# Patient Record
Sex: Male | Born: 1985 | Race: White | Hispanic: No | Marital: Married | Smoking: Never smoker
Health system: Southern US, Community
[De-identification: ages and names within clinical notes are randomized; demographics above are authoritative.]

## PROBLEM LIST (undated history)

## (undated) DIAGNOSIS — F419 Anxiety disorder, unspecified: Secondary | ICD-10-CM

## (undated) DIAGNOSIS — G4700 Insomnia, unspecified: Secondary | ICD-10-CM

## (undated) DIAGNOSIS — F1190 Opioid use, unspecified, uncomplicated: Secondary | ICD-10-CM

## (undated) HISTORY — DX: Insomnia, unspecified: G47.00

## (undated) HISTORY — DX: Opioid use, unspecified, uncomplicated: F11.90

## (undated) HISTORY — DX: Anxiety disorder, unspecified: F41.9

---

## 2019-02-02 ENCOUNTER — Other Ambulatory Visit: Payer: Self-pay | Admitting: Internal Medicine

## 2019-02-02 DIAGNOSIS — R945 Abnormal results of liver function studies: Secondary | ICD-10-CM

## 2019-02-02 DIAGNOSIS — R7989 Other specified abnormal findings of blood chemistry: Secondary | ICD-10-CM

## 2019-02-09 ENCOUNTER — Other Ambulatory Visit: Payer: Self-pay

## 2019-02-09 ENCOUNTER — Ambulatory Visit
Admission: RE | Admit: 2019-02-09 | Discharge: 2019-02-09 | Disposition: A | Discharge status: Home / Self Care | Payer: Managed Care, Other (non HMO) | Source: Ambulatory Visit | Attending: Internal Medicine | Admitting: Internal Medicine

## 2019-02-09 DIAGNOSIS — R945 Abnormal results of liver function studies: Secondary | ICD-10-CM | POA: Diagnosis present

## 2019-02-09 DIAGNOSIS — R7989 Other specified abnormal findings of blood chemistry: Secondary | ICD-10-CM

## 2019-02-09 IMAGING — US ULTRASOUND ABDOMEN LIMITED
1 series · 14 of 25 positions shown · non-contrast
Comparison: None.

CLINICAL DATA: Abnormal liver function tests.

EXAM:
ULTRASOUND ABDOMEN LIMITED RIGHT UPPER QUADRANT

[Series 1: ultrasound abdomen limited · 0.17mm/px · 14 of 77 slices shown]
[im 1/77]
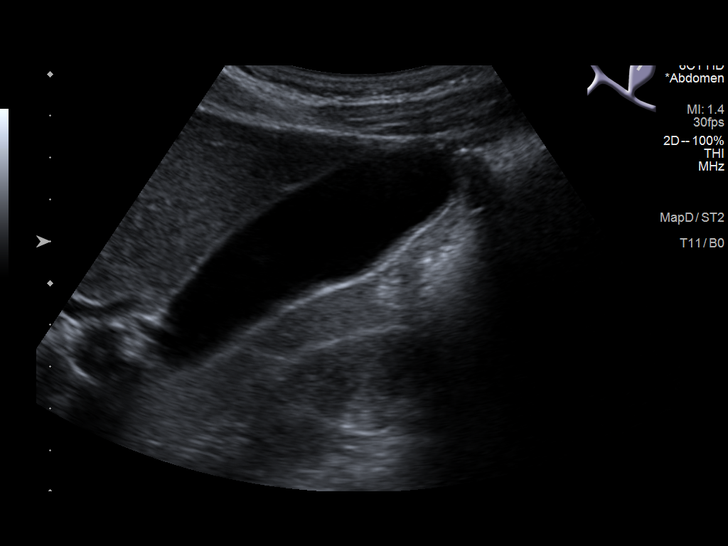
[im 7/77]
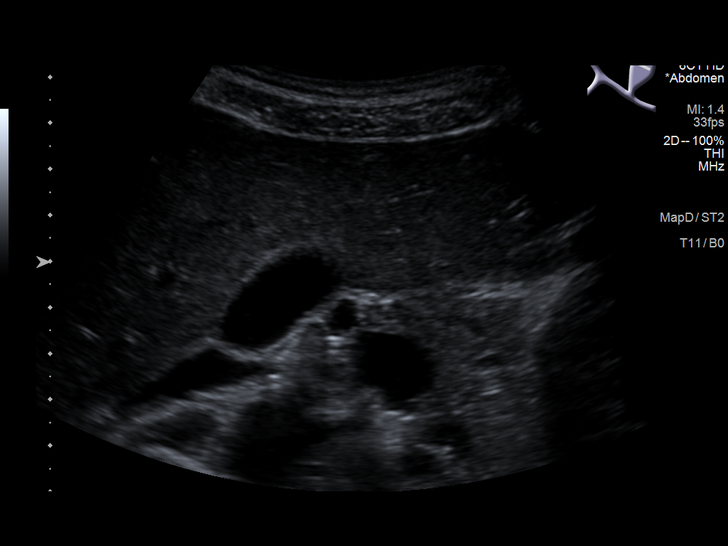
[im 13/77]
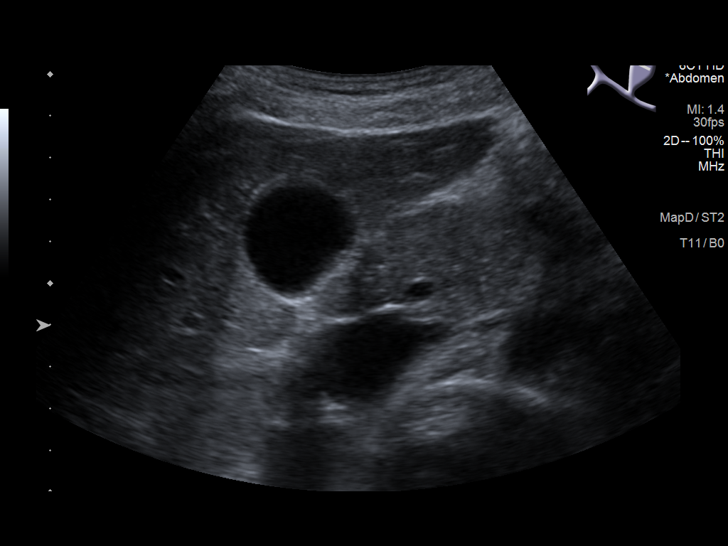
[im 20/77]
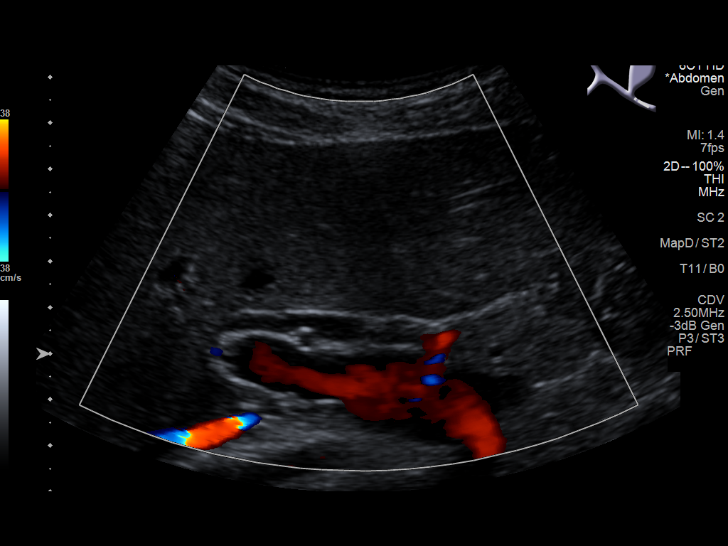
[im 26/77]
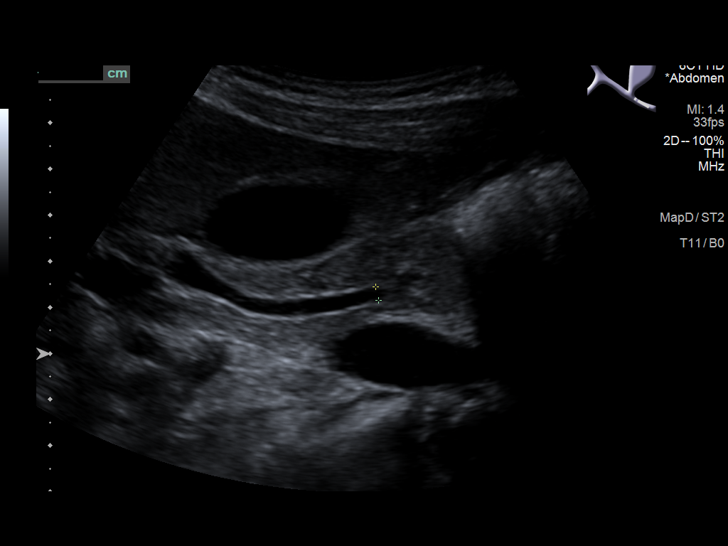
[im 29/77]
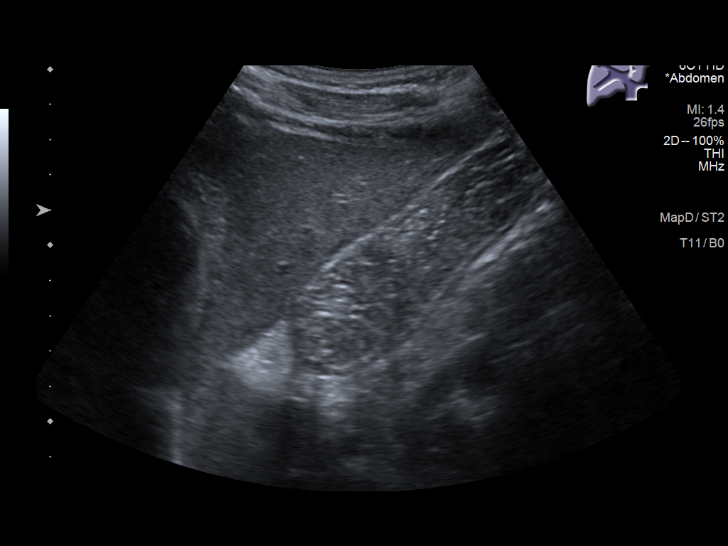
[im 35/77]
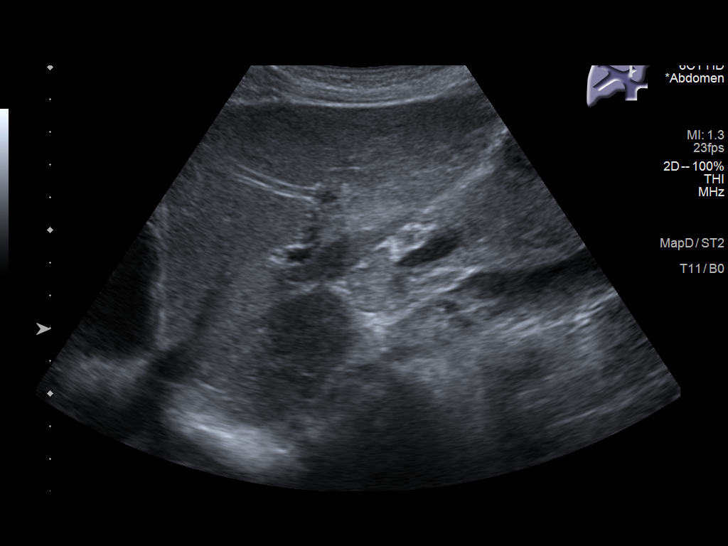
[im 42/77]
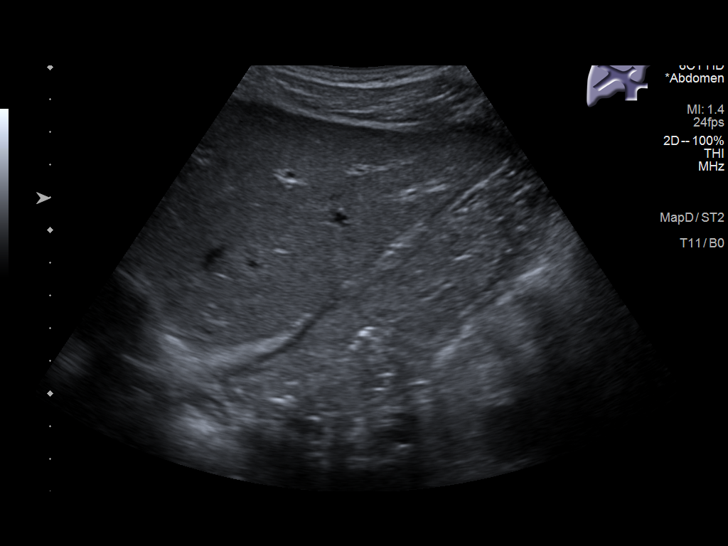
[im 48/77]
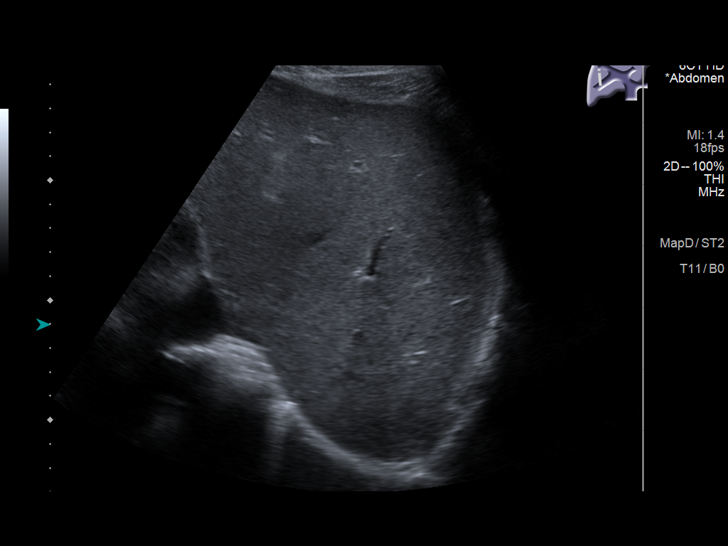
[im 51/77]
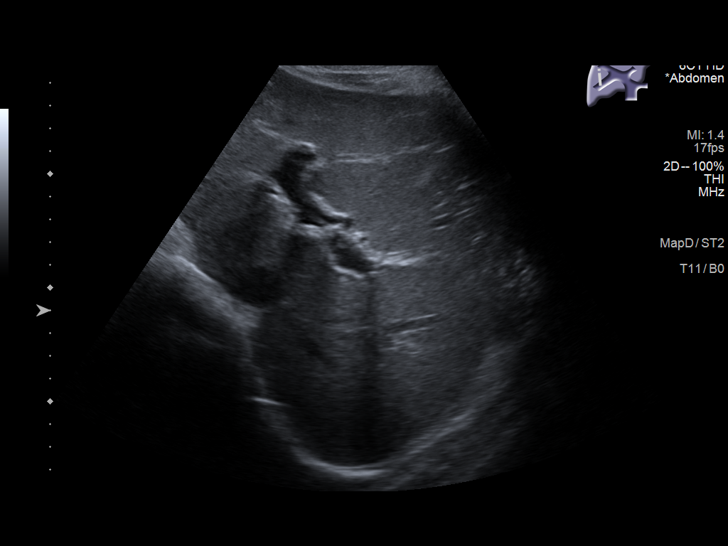
[im 58/77]
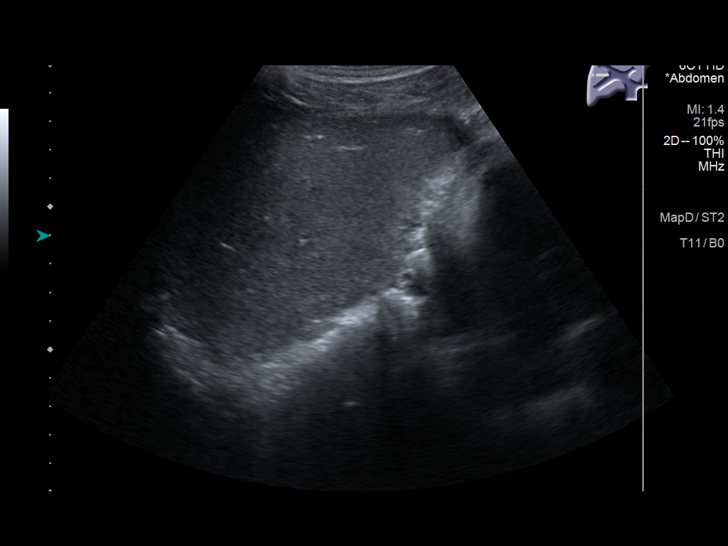
[im 64/77]
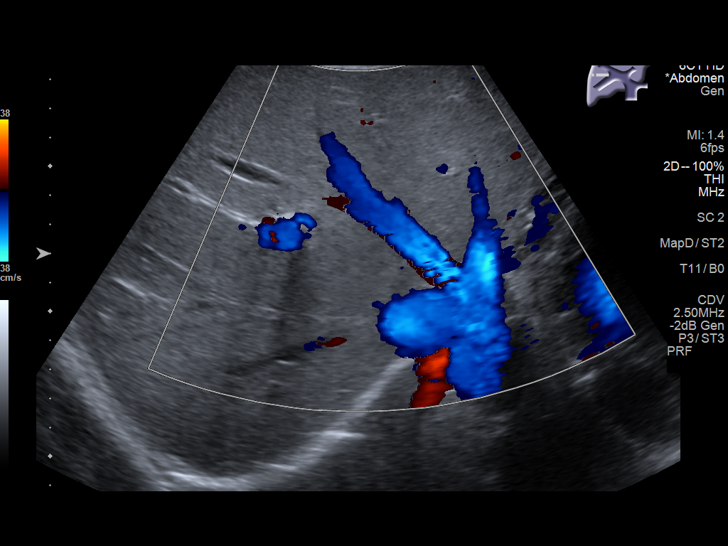
[im 70/77]
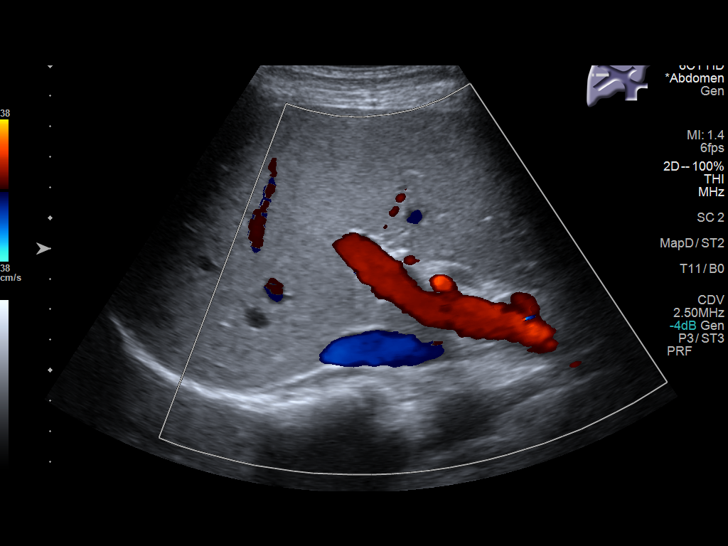
[im 77/77]
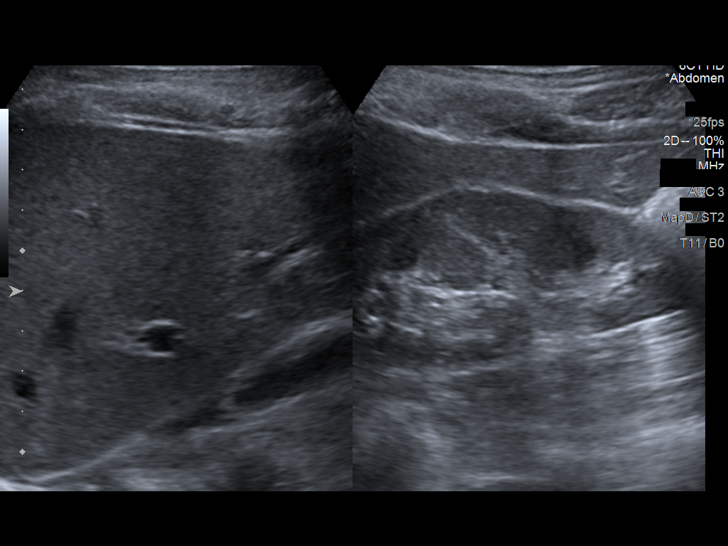

[14 of 25 positions shown; findings below may reference images not displayed]

FINDINGS: Gallbladder:

No gallstones or wall thickening visualized. No sonographic Murphy
sign noted by sonographer.

Common bile duct:

Diameter: 3.3 mm

Liver:

No focal lesion identified. Within normal limits in parenchymal
echogenicity. Portal vein is patent on color Doppler imaging with
normal direction of blood flow towards the liver.

Other: None.
IMPRESSION: Normal right upper quadrant ultrasound.

## 2019-10-11 ENCOUNTER — Other Ambulatory Visit: Payer: Self-pay | Admitting: Student

## 2019-10-11 DIAGNOSIS — M7521 Bicipital tendinitis, right shoulder: Secondary | ICD-10-CM

## 2019-10-11 DIAGNOSIS — S42114A Nondisplaced fracture of body of scapula, right shoulder, initial encounter for closed fracture: Secondary | ICD-10-CM

## 2019-10-11 DIAGNOSIS — M7581 Other shoulder lesions, right shoulder: Secondary | ICD-10-CM

## 2019-10-15 ENCOUNTER — Other Ambulatory Visit: Payer: Self-pay

## 2019-10-15 ENCOUNTER — Emergency Department
Admission: EM | Admit: 2019-10-15 | Discharge: 2019-10-15 | Disposition: A | Discharge status: Home / Self Care | Payer: Managed Care, Other (non HMO) | Attending: Emergency Medicine | Admitting: Emergency Medicine

## 2019-10-15 ENCOUNTER — Emergency Department: Payer: Managed Care, Other (non HMO)

## 2019-10-15 DIAGNOSIS — Y9389 Activity, other specified: Secondary | ICD-10-CM | POA: Insufficient documentation

## 2019-10-15 DIAGNOSIS — S91312A Laceration without foreign body, left foot, initial encounter: Secondary | ICD-10-CM | POA: Insufficient documentation

## 2019-10-15 DIAGNOSIS — Y999 Unspecified external cause status: Secondary | ICD-10-CM | POA: Insufficient documentation

## 2019-10-15 DIAGNOSIS — Y92410 Unspecified street and highway as the place of occurrence of the external cause: Secondary | ICD-10-CM | POA: Diagnosis not present

## 2019-10-15 IMAGING — DX DG FOOT COMPLETE 3+V*L*
3 series · 3 of 3 positions shown · non-contrast
Comparison: None.

CLINICAL DATA: Pain

EXAM:
LEFT FOOT - COMPLETE 3+ VIEW

[foot ap]
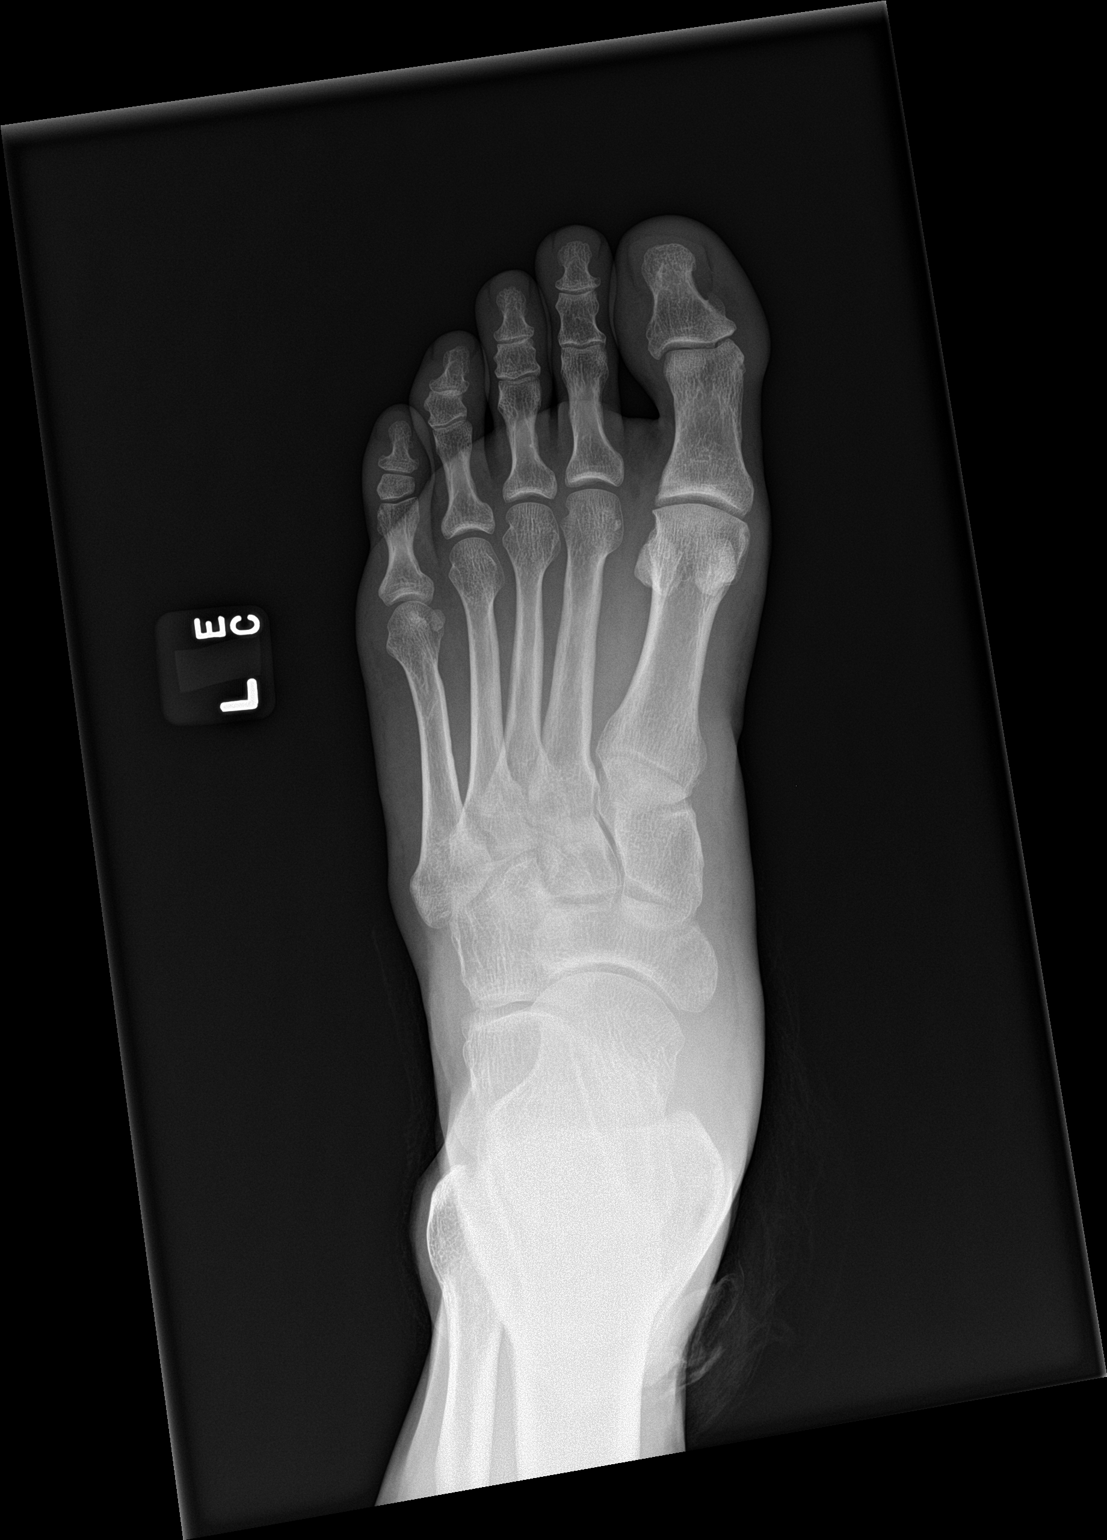

[foot obl]
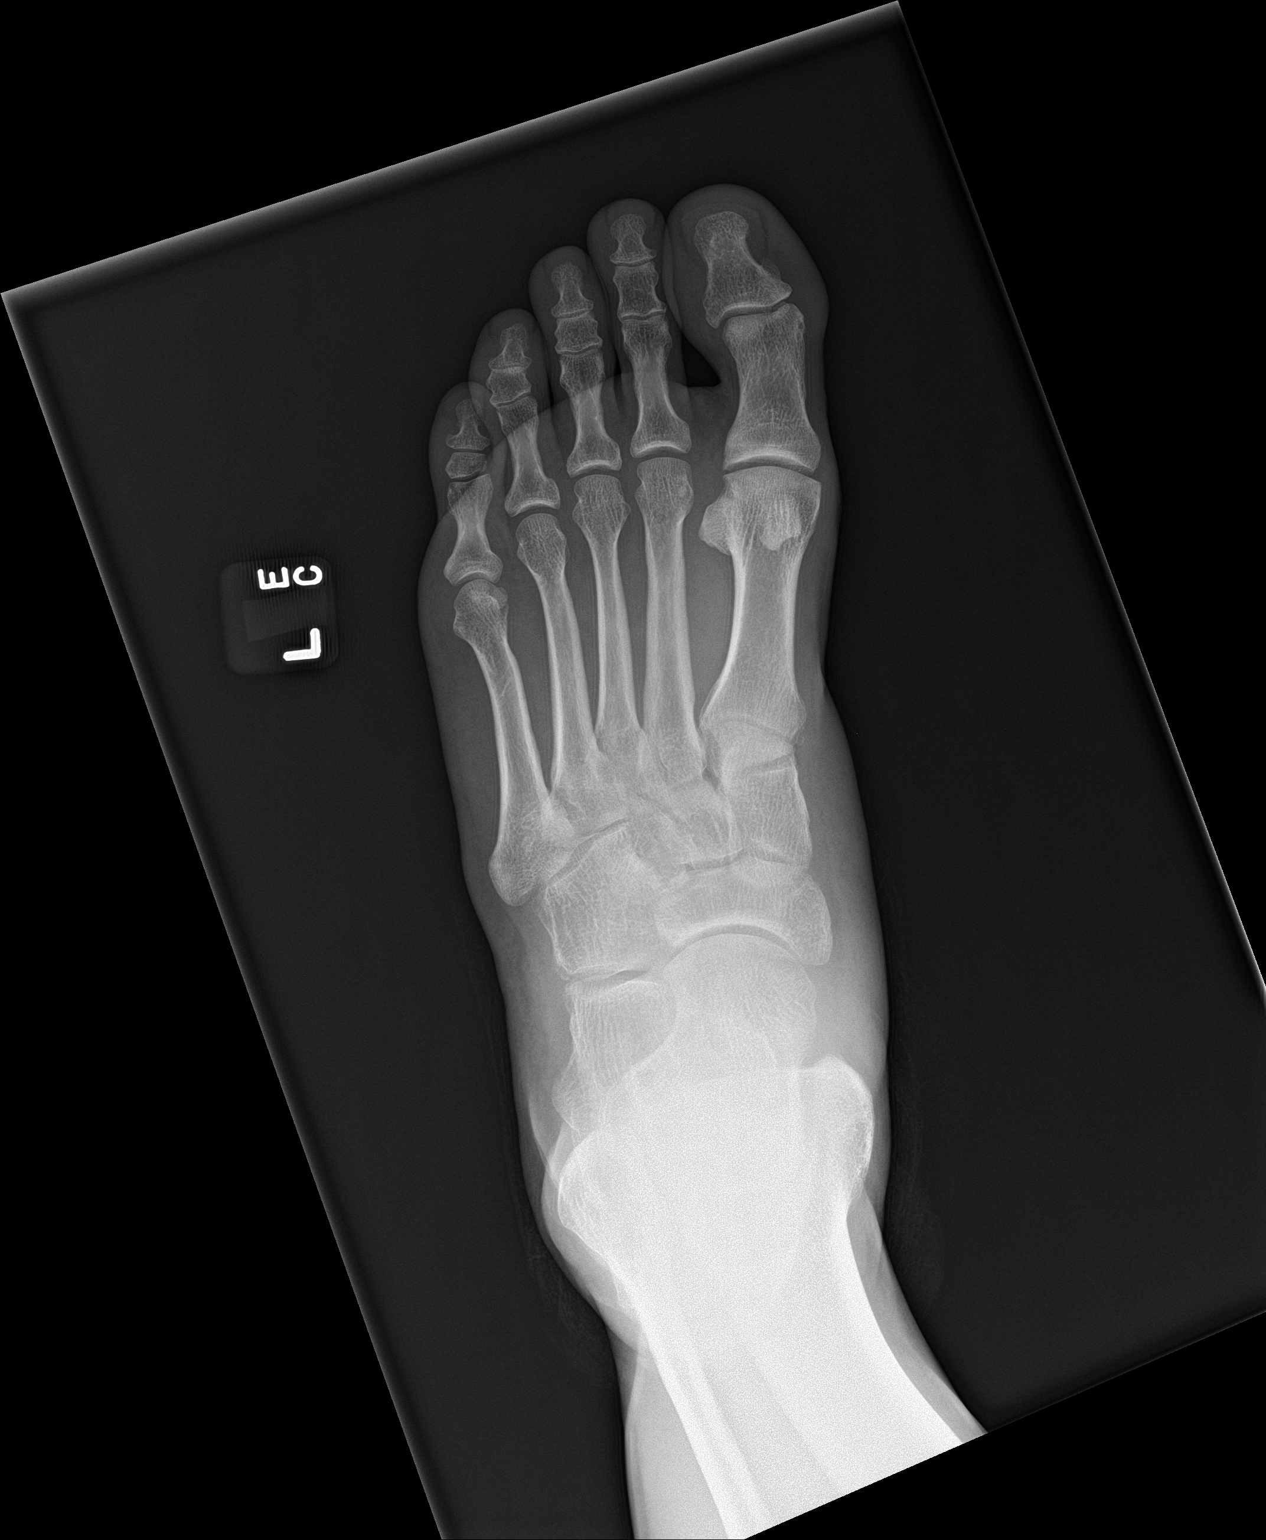

[foot lat]
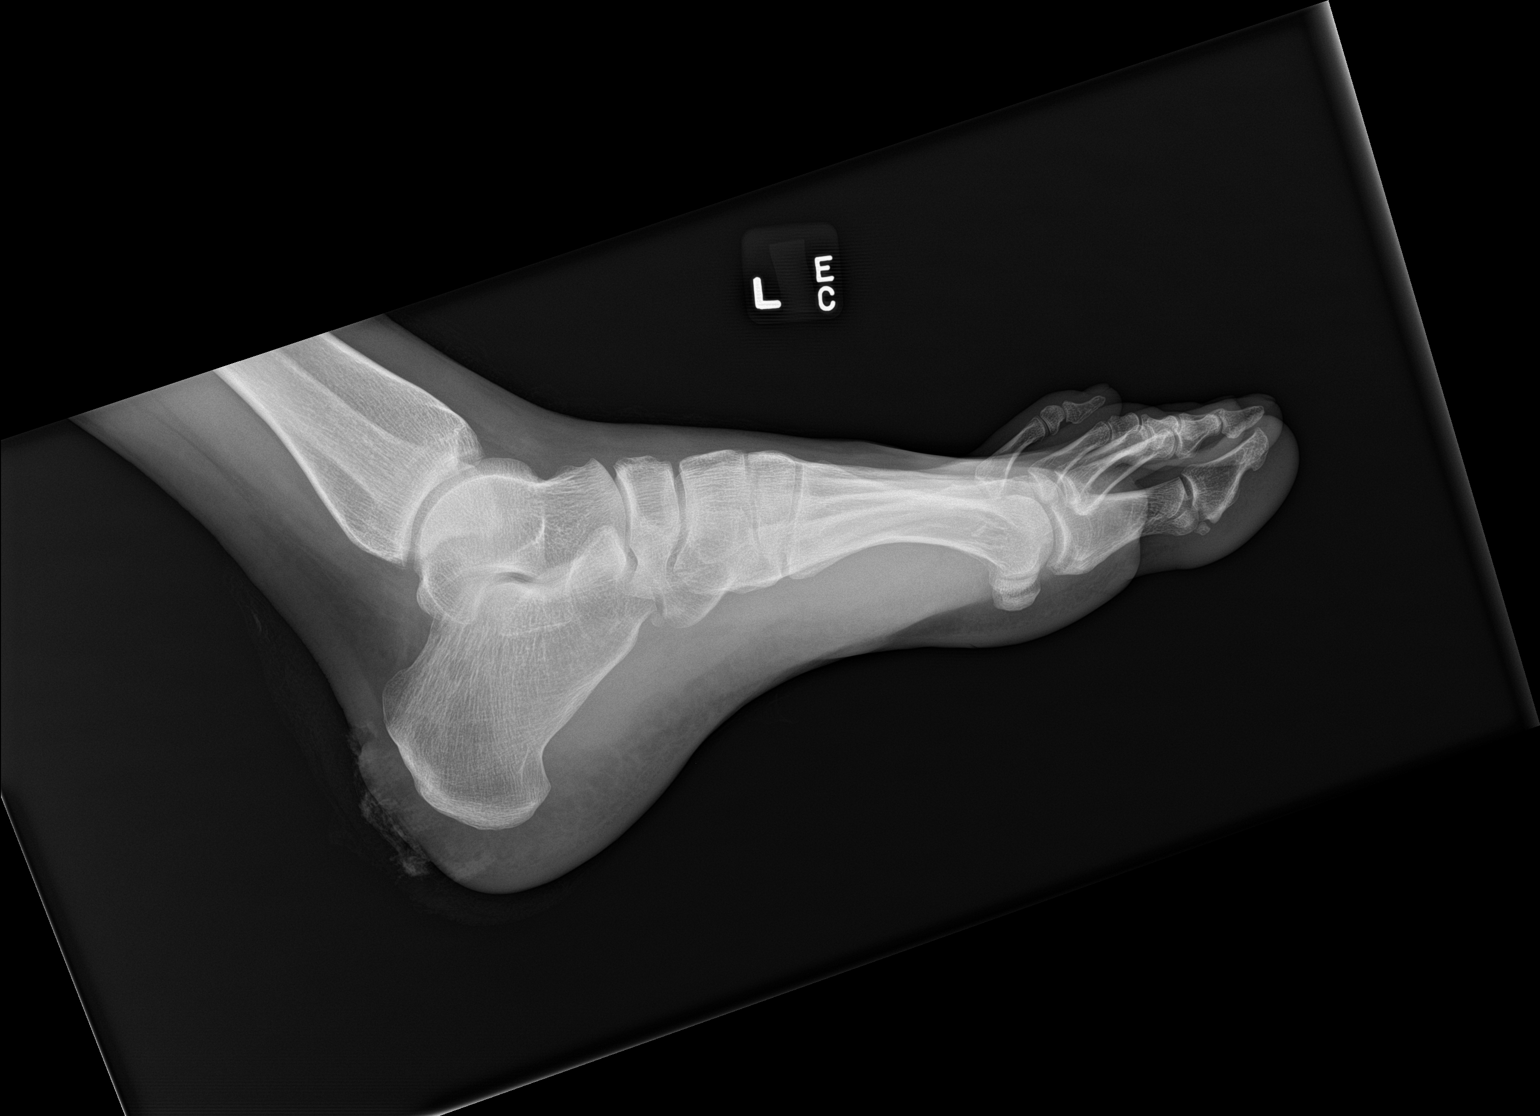

[3 of 3 positions shown; findings below may reference images not displayed]

FINDINGS: There is soft tissue swelling about the posterior calcaneus with an
associated laceration and pockets of subcutaneous gas. There is no
acute displaced fracture or dislocation. There is no radiopaque
foreign body.
IMPRESSION: No acute osseous abnormality. Soft tissue swelling about the
posterior calcaneus with associated laceration and pockets of
subcutaneous gas.
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
## 2019-10-15 MED ORDER — FENTANYL CITRATE (PF) 100 MCG/2ML IJ SOLN
50.0000 ug | Freq: Once | INTRAMUSCULAR | Status: AC
  Administered 2019-10-15: 50 ug via INTRAVENOUS
  Filled 2019-10-15: qty 2

## 2019-10-15 MED ORDER — HYDROMORPHONE HCL 1 MG/ML IJ SOLN
1.0000 mg | Freq: Once | INTRAMUSCULAR | Status: AC
  Administered 2019-10-15: 1 mg via INTRAVENOUS
  Filled 2019-10-15: qty 1

## 2019-10-15 MED ORDER — SULFAMETHOXAZOLE-TRIMETHOPRIM 800-160 MG PO TABS: 1.0000 | ORAL_TABLET | Freq: Two times a day (BID) | ORAL | 0 refills | Status: DC

## 2019-10-15 MED ORDER — LIDOCAINE HCL (PF) 1 % IJ SOLN
20.0000 mL | Freq: Once | INTRAMUSCULAR | Status: AC
  Administered 2019-10-15: 22:00:00 20 mL
  Filled 2019-10-15: qty 20

## 2019-10-15 MED ORDER — SODIUM CHLORIDE 0.9 % IV BOLUS
1000.0000 mL | Freq: Once | INTRAVENOUS | Status: AC
  Administered 2019-10-15: 21:00:00 1000 mL via INTRAVENOUS

## 2019-10-15 MED ORDER — OXYCODONE-ACETAMINOPHEN 5-325 MG PO TABS: 1.0000 | ORAL_TABLET | Freq: Four times a day (QID) | ORAL | 0 refills | Status: DC | PRN

## 2019-10-15 MED ORDER — ONDANSETRON HCL 4 MG/2ML IJ SOLN
4.0000 mg | Freq: Once | INTRAMUSCULAR | Status: AC
  Administered 2019-10-15: 4 mg via INTRAVENOUS
  Filled 2019-10-15: qty 2
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
## 2019-10-19 ENCOUNTER — Other Ambulatory Visit: Payer: Self-pay

## 2019-10-19 ENCOUNTER — Encounter: Payer: Self-pay | Admitting: Emergency Medicine

## 2019-10-19 ENCOUNTER — Emergency Department: Payer: Managed Care, Other (non HMO)

## 2019-10-19 ENCOUNTER — Inpatient Hospital Stay
Admission: EM | Admit: 2019-10-19 | Discharge: 2019-10-23 | DRG: 580 | Disposition: A | Discharge status: Home / Self Care | Payer: Managed Care, Other (non HMO) | Attending: Internal Medicine | Admitting: Internal Medicine

## 2019-10-19 DIAGNOSIS — L0390 Cellulitis, unspecified: Secondary | ICD-10-CM | POA: Diagnosis present

## 2019-10-19 DIAGNOSIS — Z20822 Contact with and (suspected) exposure to covid-19: Secondary | ICD-10-CM | POA: Diagnosis present

## 2019-10-19 DIAGNOSIS — F329 Major depressive disorder, single episode, unspecified: Secondary | ICD-10-CM | POA: Diagnosis present

## 2019-10-19 DIAGNOSIS — Z888 Allergy status to other drugs, medicaments and biological substances status: Secondary | ICD-10-CM

## 2019-10-19 DIAGNOSIS — R7989 Other specified abnormal findings of blood chemistry: Secondary | ICD-10-CM

## 2019-10-19 DIAGNOSIS — L02612 Cutaneous abscess of left foot: Secondary | ICD-10-CM | POA: Diagnosis present

## 2019-10-19 DIAGNOSIS — F0630 Mood disorder due to known physiological condition, unspecified: Secondary | ICD-10-CM

## 2019-10-19 DIAGNOSIS — S91312D Laceration without foreign body, left foot, subsequent encounter: Secondary | ICD-10-CM

## 2019-10-19 DIAGNOSIS — T148XXA Other injury of unspecified body region, initial encounter: Secondary | ICD-10-CM | POA: Diagnosis not present

## 2019-10-19 DIAGNOSIS — F32A Depression, unspecified: Secondary | ICD-10-CM

## 2019-10-19 DIAGNOSIS — L03116 Cellulitis of left lower limb: Secondary | ICD-10-CM

## 2019-10-19 DIAGNOSIS — K219 Gastro-esophageal reflux disease without esophagitis: Secondary | ICD-10-CM

## 2019-10-19 DIAGNOSIS — R945 Abnormal results of liver function studies: Secondary | ICD-10-CM

## 2019-10-19 DIAGNOSIS — Z880 Allergy status to penicillin: Secondary | ICD-10-CM

## 2019-10-19 DIAGNOSIS — Y9355 Activity, bike riding: Secondary | ICD-10-CM | POA: Diagnosis not present

## 2019-10-19 DIAGNOSIS — L089 Local infection of the skin and subcutaneous tissue, unspecified: Secondary | ICD-10-CM | POA: Diagnosis not present

## 2019-10-19 LAB — COMPREHENSIVE METABOLIC PANEL
ALT: 86 U/L — ABNORMAL HIGH (ref 0–44)
AST: 49 U/L — ABNORMAL HIGH (ref 15–41)
Albumin: 4.1 g/dL (ref 3.5–5.0)
Alkaline Phosphatase: 54 U/L (ref 38–126)
Anion gap: 7 (ref 5–15)
BUN: 19 mg/dL (ref 6–20)
CO2: 28 mmol/L (ref 22–32)
Calcium: 9.4 mg/dL (ref 8.9–10.3)
Chloride: 102 mmol/L (ref 98–111)
Creatinine, Ser: 1.04 mg/dL (ref 0.61–1.24)
GFR calc Af Amer: >60 mL/min (ref >60)
GFR calc non Af Amer: >60 mL/min (ref >60)
Glucose, Bld: 114 mg/dL — ABNORMAL HIGH (ref 70–99)
Potassium: 4 mmol/L (ref 3.5–5.1)
Sodium: 137 mmol/L (ref 135–145)
Total Bilirubin: 0.6 mg/dL (ref 0.3–1.2)
Total Protein: 7.3 g/dL (ref 6.5–8.1)

## 2019-10-19 LAB — CBC WITH DIFFERENTIAL/PLATELET
Abs Immature Granulocytes: 0.03 10*3/uL (ref 0.00–0.07)
Basophils Absolute: 0 10*3/uL (ref 0.0–0.1)
Basophils Relative: 0 %
Eosinophils Absolute: 0.5 10*3/uL (ref 0.0–0.5)
Eosinophils Relative: 5 %
HCT: 39.4 % (ref 39.0–52.0)
Hemoglobin: 13.3 g/dL (ref 13.0–17.0)
Immature Granulocytes: 0 %
Lymphocytes Relative: 25 %
Lymphs Abs: 2.4 10*3/uL (ref 0.7–4.0)
MCH: 32.5 pg (ref 26.0–34.0)
MCHC: 33.8 g/dL (ref 30.0–36.0)
MCV: 96.3 fL (ref 80.0–100.0)
Monocytes Absolute: 0.8 10*3/uL (ref 0.1–1.0)
Monocytes Relative: 8 %
Neutro Abs: 5.9 10*3/uL (ref 1.7–7.7)
Neutrophils Relative %: 62 %
Platelets: 235 10*3/uL (ref 150–400)
RBC: 4.09 MIL/uL — ABNORMAL LOW (ref 4.22–5.81)
RDW: 12.9 % (ref 11.5–15.5)
WBC: 9.5 10*3/uL (ref 4.0–10.5)
nRBC: 0 % (ref 0.0–0.2)

## 2019-10-19 LAB — URINALYSIS, COMPLETE (UACMP) WITH MICROSCOPIC
Bacteria, UA: NONE SEEN
Bilirubin Urine: NEGATIVE
Glucose, UA: NEGATIVE mg/dL
Hgb urine dipstick: NEGATIVE
Ketones, ur: NEGATIVE mg/dL
Leukocytes,Ua: NEGATIVE
Nitrite: NEGATIVE
Protein, ur: NEGATIVE mg/dL
Specific Gravity, Urine: 1.008 (ref 1.005–1.030)
pH: 6 (ref 5.0–8.0)

## 2019-10-19 LAB — LACTIC ACID, PLASMA: Lactic Acid, Venous: 0.8 mmol/L (ref 0.5–1.9)
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
## 2019-10-19 MED ORDER — MORPHINE SULFATE (PF) 4 MG/ML IV SOLN
4.0000 mg | Freq: Once | INTRAVENOUS | Status: AC
  Administered 2019-10-19: 4 mg via INTRAVENOUS
  Filled 2019-10-19: qty 1

## 2019-10-19 MED ORDER — CELECOXIB 200 MG PO CAPS
200.0000 mg | ORAL_CAPSULE | Freq: Two times a day (BID) | ORAL | Status: DC
  Administered 2019-10-20 – 2019-10-23 (×8): 200 mg via ORAL
  Filled 2019-10-19 (×9): qty 1

## 2019-10-19 MED ORDER — CLONAZEPAM 1 MG PO TABS: 1.0000 mg | ORAL_TABLET | Freq: Two times a day (BID) | ORAL | Status: DC | PRN

## 2019-10-19 MED ORDER — SODIUM CHLORIDE 0.9% FLUSH
3.0000 mL | Freq: Once | INTRAVENOUS | Status: AC
  Administered 2019-10-20: 3 mL via INTRAVENOUS

## 2019-10-19 MED ORDER — MORPHINE SULFATE (PF) 2 MG/ML IV SOLN
1.0000 mg | INTRAVENOUS | Status: DC | PRN
  Administered 2019-10-20 (×2): 1 mg via INTRAVENOUS
  Filled 2019-10-19 (×2): qty 1

## 2019-10-19 MED ORDER — OXYCODONE-ACETAMINOPHEN 7.5-325 MG PO TABS
1.0000 | ORAL_TABLET | Freq: Four times a day (QID) | ORAL | Status: DC | PRN
  Administered 2019-10-20 (×2): 1 via ORAL
  Filled 2019-10-19 (×2): qty 1

## 2019-10-19 MED ORDER — GADOBUTROL 1 MMOL/ML IV SOLN
7.0000 mL | Freq: Once | INTRAVENOUS | Status: AC | PRN
  Administered 2019-10-19: 7 mL via INTRAVENOUS
  Filled 2019-10-19: qty 7.5

## 2019-10-19 MED ORDER — HYDROMORPHONE HCL 1 MG/ML IJ SOLN: 1.0000 mg | Freq: Once | INTRAMUSCULAR | Status: AC

## 2019-10-19 MED ORDER — PANTOPRAZOLE SODIUM 40 MG PO TBEC
40.0000 mg | DELAYED_RELEASE_TABLET | Freq: Every day | ORAL | Status: DC
  Administered 2019-10-20 – 2019-10-23 (×4): 40 mg via ORAL
  Filled 2019-10-19 (×4): qty 1

## 2019-10-19 MED ORDER — HYDROMORPHONE HCL 1 MG/ML IJ SOLN
INTRAMUSCULAR | Status: AC
  Administered 2019-10-19: 1 mg via INTRAVENOUS
  Filled 2019-10-19: qty 1

## 2019-10-19 MED ORDER — VANCOMYCIN HCL IN DEXTROSE 1-5 GM/200ML-% IV SOLN
1000.0000 mg | Freq: Once | INTRAVENOUS | Status: AC
  Administered 2019-10-19: 1000 mg via INTRAVENOUS
  Filled 2019-10-19: qty 200

## 2019-10-19 MED ORDER — CLINDAMYCIN PHOSPHATE 600 MG/50ML IV SOLN
600.0000 mg | Freq: Three times a day (TID) | INTRAVENOUS | Status: DC
  Administered 2019-10-20 – 2019-10-23 (×11): 600 mg via INTRAVENOUS
  Filled 2019-10-19 (×14): qty 50
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
## 2019-10-19 NOTE — ED Notes (Signed)
FIRST NURSE NOTE- here for cellulitis from dr Engineer, production office. On crutches without distress. Splint noted to RLE

## 2019-10-19 NOTE — ED Notes (Signed)
Patient transported to MRI 

## 2019-10-19 NOTE — ED Notes (Signed)
Pt reports dr Engineer, production supposed to be taking to surgery. No one has heard from dr Engineer, production.  Dr Mayford Knife to speak with pt and will find out from on call doctor plan.
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
## 2019-10-19 NOTE — ED Notes (Signed)
Collected 1 set of blood cultures with iv start and sent to lab with temporary labels.

## 2019-10-19 NOTE — ED Notes (Signed)
Patient is in MRI, patient's wife at bedside, given TV remote and recliner

## 2019-10-20 ENCOUNTER — Inpatient Hospital Stay: Payer: Managed Care, Other (non HMO) | Admitting: Anesthesiology

## 2019-10-20 ENCOUNTER — Encounter: Payer: Self-pay | Admitting: Family Medicine

## 2019-10-20 ENCOUNTER — Encounter
Admission: EM | Disposition: A | Discharge status: Home / Self Care | Payer: Self-pay | Source: Home / Self Care | Attending: Internal Medicine

## 2019-10-20 ENCOUNTER — Other Ambulatory Visit: Payer: Self-pay

## 2019-10-20 DIAGNOSIS — F329 Major depressive disorder, single episode, unspecified: Secondary | ICD-10-CM

## 2019-10-20 DIAGNOSIS — K219 Gastro-esophageal reflux disease without esophagitis: Secondary | ICD-10-CM

## 2019-10-20 DIAGNOSIS — R945 Abnormal results of liver function studies: Secondary | ICD-10-CM

## 2019-10-20 DIAGNOSIS — L03116 Cellulitis of left lower limb: Secondary | ICD-10-CM

## 2019-10-20 HISTORY — PX: IRRIGATION AND DEBRIDEMENT FOOT: SHX6602

## 2019-10-20 LAB — CBC
HCT: 34.6 % — ABNORMAL LOW (ref 39.0–52.0)
Hemoglobin: 12.1 g/dL — ABNORMAL LOW (ref 13.0–17.0)
MCH: 32.9 pg (ref 26.0–34.0)
MCHC: 35 g/dL (ref 30.0–36.0)
MCV: 94 fL (ref 80.0–100.0)
Platelets: 201 10*3/uL (ref 150–400)
RBC: 3.68 MIL/uL — ABNORMAL LOW (ref 4.22–5.81)
RDW: 12.9 % (ref 11.5–15.5)
WBC: 7 10*3/uL (ref 4.0–10.5)
nRBC: 0 % (ref 0.0–0.2)

## 2019-10-20 LAB — BASIC METABOLIC PANEL
Anion gap: 6 (ref 5–15)
BUN: 19 mg/dL (ref 6–20)
CO2: 28 mmol/L (ref 22–32)
Calcium: 8.9 mg/dL (ref 8.9–10.3)
Chloride: 102 mmol/L (ref 98–111)
Creatinine, Ser: 1.04 mg/dL (ref 0.61–1.24)
GFR calc Af Amer: >60 mL/min (ref >60)
GFR calc non Af Amer: >60 mL/min (ref >60)
Glucose, Bld: 84 mg/dL (ref 70–99)
Potassium: 4.1 mmol/L (ref 3.5–5.1)
Sodium: 136 mmol/L (ref 135–145)

## 2019-10-20 LAB — HIV ANTIBODY (ROUTINE TESTING W REFLEX): HIV Screen 4th Generation wRfx: NONREACTIVE

## 2019-10-20 LAB — SARS CORONAVIRUS 2 (TAT 6-24 HRS): SARS Coronavirus 2: NEGATIVE

## 2019-10-20 LAB — RESPIRATORY PANEL BY RT PCR (FLU A&B, COVID)
Influenza A by PCR: NEGATIVE
Influenza B by PCR: NEGATIVE
SARS Coronavirus 2 by RT PCR: NEGATIVE

## 2019-10-20 SURGERY — IRRIGATION AND DEBRIDEMENT FOOT
Anesthesia: General | Site: Foot | Laterality: Left

## 2019-10-20 MED ORDER — EPHEDRINE 5 MG/ML INJ
INTRAVENOUS | Status: AC
  Filled 2019-10-20: qty 10

## 2019-10-20 MED ORDER — ACETAMINOPHEN 10 MG/ML IV SOLN
INTRAVENOUS | Status: AC
  Filled 2019-10-20: qty 100

## 2019-10-20 MED ORDER — FENTANYL CITRATE (PF) 100 MCG/2ML IJ SOLN
INTRAMUSCULAR | Status: AC
  Administered 2019-10-20: 25 ug via INTRAVENOUS
  Filled 2019-10-20: qty 2

## 2019-10-20 MED ORDER — SUGAMMADEX SODIUM 200 MG/2ML IV SOLN
INTRAVENOUS | Status: DC | PRN
  Administered 2019-10-20: 200 mg via INTRAVENOUS

## 2019-10-20 MED ORDER — EPHEDRINE SULFATE 50 MG/ML IJ SOLN
INTRAMUSCULAR | Status: DC | PRN
  Administered 2019-10-20 (×2): 10 mg via INTRAVENOUS

## 2019-10-20 MED ORDER — ONDANSETRON HCL 4 MG/2ML IJ SOLN: 4.0000 mg | Freq: Once | INTRAMUSCULAR | Status: DC | PRN

## 2019-10-20 MED ORDER — OXYCODONE-ACETAMINOPHEN 7.5-325 MG PO TABS: 1.0000 | ORAL_TABLET | Freq: Four times a day (QID) | ORAL | Status: DC | PRN

## 2019-10-20 MED ORDER — OXYCODONE-ACETAMINOPHEN 7.5-325 MG PO TABS
1.0000 | ORAL_TABLET | ORAL | Status: DC | PRN
  Administered 2019-10-20 – 2019-10-23 (×8): 2 via ORAL
  Filled 2019-10-20 (×8): qty 2

## 2019-10-20 MED ORDER — ONDANSETRON HCL 4 MG/2ML IJ SOLN
INTRAMUSCULAR | Status: AC
  Filled 2019-10-20: qty 2

## 2019-10-20 MED ORDER — DOCUSATE SODIUM 100 MG PO CAPS
100.0000 mg | ORAL_CAPSULE | Freq: Two times a day (BID) | ORAL | Status: DC
  Administered 2019-10-20 – 2019-10-23 (×6): 100 mg via ORAL
  Filled 2019-10-20 (×6): qty 1

## 2019-10-20 MED ORDER — PROPOFOL 10 MG/ML IV BOLUS
INTRAVENOUS | Status: AC
  Filled 2019-10-20: qty 20

## 2019-10-20 MED ORDER — ACETAMINOPHEN 10 MG/ML IV SOLN
INTRAVENOUS | Status: DC | PRN
  Administered 2019-10-20: 1000 mg via INTRAVENOUS

## 2019-10-20 MED ORDER — ENOXAPARIN SODIUM 40 MG/0.4ML ~~LOC~~ SOLN
40.0000 mg | SUBCUTANEOUS | Status: DC
  Administered 2019-10-21 – 2019-10-23 (×3): 40 mg via SUBCUTANEOUS
  Filled 2019-10-20 (×3): qty 0.4

## 2019-10-20 MED ORDER — BUPIVACAINE HCL (PF) 0.5 % IJ SOLN
INTRAMUSCULAR | Status: DC | PRN
  Administered 2019-10-20: 8 mL

## 2019-10-20 MED ORDER — MIDAZOLAM HCL 2 MG/2ML IJ SOLN
INTRAMUSCULAR | Status: DC | PRN
  Administered 2019-10-20: 2 mg via INTRAVENOUS

## 2019-10-20 MED ORDER — PHENYLEPHRINE HCL (PRESSORS) 10 MG/ML IV SOLN
INTRAVENOUS | Status: DC | PRN
  Administered 2019-10-20 (×3): 50 ug via INTRAVENOUS

## 2019-10-20 MED ORDER — FENTANYL CITRATE (PF) 100 MCG/2ML IJ SOLN
INTRAMUSCULAR | Status: AC
  Filled 2019-10-20: qty 2

## 2019-10-20 MED ORDER — MIDAZOLAM HCL 2 MG/2ML IJ SOLN
INTRAMUSCULAR | Status: AC
  Filled 2019-10-20: qty 2

## 2019-10-20 MED ORDER — SENNA 8.6 MG PO TABS: 1.0000 | ORAL_TABLET | Freq: Every day | ORAL | Status: DC | PRN

## 2019-10-20 MED ORDER — OXYCODONE-ACETAMINOPHEN 7.5-325 MG PO TABS
1.0000 | ORAL_TABLET | ORAL | Status: DC | PRN
  Administered 2019-10-20: 1 via ORAL
  Filled 2019-10-20: qty 1

## 2019-10-20 MED ORDER — CLINDAMYCIN PHOSPHATE 600 MG/50ML IV SOLN
INTRAVENOUS | Status: AC
  Filled 2019-10-20: qty 50

## 2019-10-20 MED ORDER — ROCURONIUM BROMIDE 100 MG/10ML IV SOLN
INTRAVENOUS | Status: DC | PRN
  Administered 2019-10-20: 50 mg via INTRAVENOUS

## 2019-10-20 MED ORDER — HYDROMORPHONE HCL 1 MG/ML IJ SOLN
2.0000 mg | INTRAMUSCULAR | Status: DC | PRN
  Administered 2019-10-20 – 2019-10-23 (×6): 2 mg via INTRAVENOUS
  Filled 2019-10-20 (×7): qty 2

## 2019-10-20 MED ORDER — FENTANYL CITRATE (PF) 100 MCG/2ML IJ SOLN
25.0000 ug | INTRAMUSCULAR | Status: DC | PRN
  Administered 2019-10-20 (×3): 25 ug via INTRAVENOUS

## 2019-10-20 MED ORDER — PROPOFOL 10 MG/ML IV BOLUS
INTRAVENOUS | Status: DC | PRN
  Administered 2019-10-20: 160 mg via INTRAVENOUS

## 2019-10-20 MED ORDER — LIDOCAINE HCL (CARDIAC) PF 100 MG/5ML IV SOSY
PREFILLED_SYRINGE | INTRAVENOUS | Status: DC | PRN
  Administered 2019-10-20: 80 mg via INTRAVENOUS

## 2019-10-20 MED ORDER — FENTANYL CITRATE (PF) 100 MCG/2ML IJ SOLN
INTRAMUSCULAR | Status: DC | PRN
  Administered 2019-10-20 (×2): 50 ug via INTRAVENOUS

## 2019-10-20 MED ORDER — LIDOCAINE HCL (PF) 2 % IJ SOLN
INTRAMUSCULAR | Status: AC
  Filled 2019-10-20: qty 5

## 2019-10-20 MED ORDER — DEXAMETHASONE SODIUM PHOSPHATE 10 MG/ML IJ SOLN
INTRAMUSCULAR | Status: AC
  Filled 2019-10-20: qty 1

## 2019-10-20 MED ORDER — LACTATED RINGERS IV SOLN: INTRAVENOUS | Status: DC | PRN

## 2019-10-20 MED ORDER — ONDANSETRON HCL 4 MG/2ML IJ SOLN
INTRAMUSCULAR | Status: DC | PRN
  Administered 2019-10-20: 4 mg via INTRAVENOUS

## 2019-10-20 MED ORDER — LIDOCAINE HCL (PF) 2 % IJ SOLN
INTRAMUSCULAR | Status: AC
  Filled 2019-10-20: qty 10

## 2019-10-20 MED ORDER — ROCURONIUM BROMIDE 10 MG/ML (PF) SYRINGE
PREFILLED_SYRINGE | INTRAVENOUS | Status: AC
  Filled 2019-10-20: qty 10

## 2019-10-20 MED ORDER — DEXAMETHASONE SODIUM PHOSPHATE 10 MG/ML IJ SOLN
INTRAMUSCULAR | Status: DC | PRN
  Administered 2019-10-20: 5 mg via INTRAVENOUS

## 2019-10-20 SURGICAL SUPPLY — 51 items
"PENCIL ELECTRO HAND CTR " (MISCELLANEOUS) ×1 IMPLANT
BLADE SURG 15 STRL LF DISP TIS (BLADE) ×1 IMPLANT
BLADE SURG 15 STRL SS (BLADE) ×2
BNDG CONFORM 2 STRL LF (GAUZE/BANDAGES/DRESSINGS) ×3 IMPLANT
BNDG ELASTIC 4X5.8 VLCR NS LF (GAUZE/BANDAGES/DRESSINGS) ×3 IMPLANT
BNDG ELASTIC 4X5.8 VLCR STR LF (GAUZE/BANDAGES/DRESSINGS) ×2 IMPLANT
BNDG GAUZE 4.5X4.1 6PLY STRL (MISCELLANEOUS) ×3 IMPLANT
BNDG STRETCH 4X75 STRL LF (GAUZE/BANDAGES/DRESSINGS) ×2 IMPLANT
CANISTER SUCT 1200ML W/VALVE (MISCELLANEOUS) ×5 IMPLANT
COVER WAND RF STERILE (DRAPES) ×3 IMPLANT
CUFF TOURN SGL QUICK 18X4 (TOURNIQUET CUFF) ×2 IMPLANT
DURAPREP 26ML APPLICATOR (WOUND CARE) ×3 IMPLANT
ELECT REM PT RETURN 9FT ADLT (ELECTROSURGICAL) ×3
ELECTRODE REM PT RTRN 9FT ADLT (ELECTROSURGICAL) ×1 IMPLANT
GAUZE PACKING IODOFORM 1/2 (PACKING) ×4 IMPLANT
GAUZE SPONGE 4X4 12PLY STRL (GAUZE/BANDAGES/DRESSINGS) ×5 IMPLANT
GAUZE SPONGE 4X4 16PLY XRAY LF (GAUZE/BANDAGES/DRESSINGS) ×2 IMPLANT
GAUZE XEROFORM 1X8 LF (GAUZE/BANDAGES/DRESSINGS) ×2 IMPLANT
GLOVE BIO SURGEON STRL SZ7.5 (GLOVE) ×3 IMPLANT
GLOVE INDICATOR 8.0 STRL GRN (GLOVE) ×3 IMPLANT
GOWN STRL REUS W/ TWL LRG LVL3 (GOWN DISPOSABLE) ×2 IMPLANT
GOWN STRL REUS W/TWL LRG LVL3 (GOWN DISPOSABLE) ×4
HANDPIECE VERSAJET DEBRIDEMENT (MISCELLANEOUS) ×5 IMPLANT
IV NS IRRIG 3000ML ARTHROMATIC (IV SOLUTION) ×4 IMPLANT
KIT TURNOVER KIT A (KITS) ×3 IMPLANT
LABEL OR SOLS (LABEL) ×3 IMPLANT
NDL FILTER BLUNT 18X1 1/2 (NEEDLE) ×1 IMPLANT
NDL HYPO 25X1 1.5 SAFETY (NEEDLE) ×3 IMPLANT
NEEDLE FILTER BLUNT 18X 1/2SAF (NEEDLE) ×2
NEEDLE FILTER BLUNT 18X1 1/2 (NEEDLE) ×1 IMPLANT
NEEDLE HYPO 25X1 1.5 SAFETY (NEEDLE) ×9 IMPLANT
NS IRRIG 500ML POUR BTL (IV SOLUTION) ×3 IMPLANT
PACK EXTREMITY ARMC (MISCELLANEOUS) ×3 IMPLANT
PAD ABD DERMACEA PRESS 5X9 (GAUZE/BANDAGES/DRESSINGS) ×4 IMPLANT
PENCIL ELECTRO HAND CTR (MISCELLANEOUS) ×3 IMPLANT
PULSAVAC PLUS IRRIG FAN TIP (DISPOSABLE) ×3
SOL PREP PVP 2OZ (MISCELLANEOUS) ×3
SOLUTION PREP PVP 2OZ (MISCELLANEOUS) ×1 IMPLANT
STOCKINETTE 48X4 2 PLY STRL (GAUZE/BANDAGES/DRESSINGS) ×1 IMPLANT
STOCKINETTE STRL 4IN 9604848 (GAUZE/BANDAGES/DRESSINGS) ×3 IMPLANT
SUT ETHILON 3-0 FS-10 30 BLK (SUTURE) ×3
SUT ETHILON 4-0 (SUTURE) ×2
SUT ETHILON 4-0 FS2 18XMFL BLK (SUTURE) ×1
SUT VIC AB 3-0 SH 27 (SUTURE) ×2
SUT VIC AB 3-0 SH 27X BRD (SUTURE) ×1 IMPLANT
SUT VIC AB 4-0 FS2 27 (SUTURE) ×3 IMPLANT
SUTURE EHLN 3-0 FS-10 30 BLK (SUTURE) ×1 IMPLANT
SUTURE ETHLN 4-0 FS2 18XMF BLK (SUTURE) ×1 IMPLANT
SWAB CULTURE AMIES ANAERIB BLU (MISCELLANEOUS) ×2 IMPLANT
SYR 10ML LL (SYRINGE) ×6 IMPLANT
TIP FAN IRRIG PULSAVAC PLUS (DISPOSABLE) IMPLANT

## 2019-10-20 NOTE — ED Notes (Signed)
Patient is asleep at this time.  

## 2019-10-20 NOTE — Anesthesia Procedure Notes (Signed)
Procedure Name: Intubation Date/Time: 10/20/2019 5:41 PM Performed by: Elmarie Mainland, CRNA Pre-anesthesia Checklist: Emergency Drugs available, Patient identified, Suction available and Patient being monitored Patient Re-evaluated:Patient Re-evaluated prior to induction Oxygen Delivery Method: Circle system utilized Preoxygenation: Pre-oxygenation with 100% oxygen Induction Type: IV induction Ventilation: Mask ventilation without difficulty Laryngoscope Size: McGraph and 4 Grade View: Grade I Tube type: Oral Tube size: 7.5 mm Number of attempts: 1 Airway Equipment and Method: Stylet,  Oral airway and Video-laryngoscopy Placement Confirmation: ETT inserted through vocal cords under direct vision,  positive ETCO2 and breath sounds checked- equal and bilateral Secured at: 22 cm Tube secured with: Tape Dental Injury: Teeth and Oropharynx as per pre-operative assessment

## 2019-10-20 NOTE — ED Notes (Signed)
Patient resting, no complaints. Wife at bedside
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
## 2019-10-20 NOTE — Anesthesia Postprocedure Evaluation (Signed)
Anesthesia Post Note  Patient: Ralph Fitzgerald  Procedure(s) Performed: IRRIGATION AND DEBRIDEMENT FOOT (Left Foot)  Patient location during evaluation: PACU Anesthesia Type: General Level of consciousness: awake and alert Pain management: pain level controlled Vital Signs Assessment: post-procedure vital signs reviewed and stable Respiratory status: spontaneous breathing and respiratory function stable Cardiovascular status: stable Anesthetic complications: no     Last Vitals:  Vitals:   10/20/19 1908 10/20/19 1923  BP: 132/73 132/61  Pulse: 97 92  Resp: (!) 21 14  Temp:    SpO2: 99% 97%    Last Pain:  Vitals:   10/20/19 1935  TempSrc:   PainSc: 4                  Michael Ventresca K
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
## 2019-10-20 NOTE — Interval H&P Note (Signed)
History and Physical Interval Note:  10/20/2019 5:27 PM  Ralph Fitzgerald  has presented today for surgery, with the diagnosis of Left Foot Abscess.  The various methods of treatment have been discussed with the patient and family. After consideration of risks, benefits and other options for treatment, the patient has consented to  Procedure(s): IRRIGATION AND DEBRIDEMENT FOOT (Left) as a surgical intervention.  The patient's history has been reviewed, patient examined, no change in status, stable for surgery.  I have reviewed the patient's chart and labs.  Questions were answered to the patient's satisfaction.     Ricci Barker
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
## 2019-10-20 NOTE — Transfer of Care (Signed)
Immediate Anesthesia Transfer of Care Note  Patient: Ralph Fitzgerald  Procedure(s) Performed: IRRIGATION AND DEBRIDEMENT FOOT (Left Foot)  Patient Location: PACU  Anesthesia Type:General  Level of Consciousness: awake, oriented, drowsy and patient cooperative  Airway & Oxygen Therapy: Patient Spontanous Breathing  Post-op Assessment: Report given to RN, Post -op Vital signs reviewed and stable and Patient moving all extremities  Post vital signs: Reviewed and stable  Last Vitals:  Vitals Value Taken Time  BP 145/58 10/20/19 1853  Temp 36.4 C 10/20/19 1853  Pulse 105 10/20/19 1853  Resp 14 10/20/19 1853  SpO2 99 % 10/20/19 1853    Last Pain:  Vitals:   10/20/19 1853  TempSrc:   PainSc: 0-No pain         Complications: No apparent anesthesia complications

## 2019-10-20 NOTE — Anesthesia Preprocedure Evaluation (Addendum)
Anesthesia Evaluation  Patient identified by MRN, date of birth, ID band Patient awake    Reviewed: Allergy & Precautions, H&P , NPO status , Patient's Chart, lab work & pertinent test results, reviewed documented beta blocker date and time   History of Anesthesia Complications Negative for: history of anesthetic complications  Airway Mallampati: II   Neck ROM: full    Dental  (+) Poor Dentition, Partial Upper   Pulmonary neg sleep apnea, neg COPD, Not current smoker,           Cardiovascular (-) hypertension(-) Past MI and (-) CHF Normal cardiovascular exam(-) dysrhythmias (-) Valvular Problems/Murmurs     Neuro/Psych neg Seizures PSYCHIATRIC DISORDERS Depression    GI/Hepatic Neg liver ROS, GERD  Medicated and Controlled,  Endo/Other  neg diabetes  Renal/GU negative Renal ROS  negative genitourinary   Musculoskeletal   Abdominal   Peds  Hematology   Anesthesia Other Findings     Reproductive/Obstetrics negative OB ROS                            Anesthesia Physical Anesthesia Plan  ASA: III and emergent  Anesthesia Plan: General   Post-op Pain Management:    Induction: Intravenous  PONV Risk Score and Plan: 2 and Ondansetron and Dexamethasone  Airway Management Planned: Oral ETT  Additional Equipment:   Intra-op Plan:   Post-operative Plan:   Informed Consent: I have reviewed the patients History and Physical, chart, labs and discussed the procedure including the risks, benefits and alternatives for the proposed anesthesia with the patient or authorized representative who has indicated his/her understanding and acceptance.       Plan Discussed with: CRNA  Anesthesia Plan Comments:        Anesthesia Quick Evaluation

## 2019-10-21 DIAGNOSIS — L089 Local infection of the skin and subcutaneous tissue, unspecified: Secondary | ICD-10-CM

## 2019-10-21 DIAGNOSIS — T148XXA Other injury of unspecified body region, initial encounter: Secondary | ICD-10-CM
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
## 2019-10-21 NOTE — Progress Notes (Signed)
Spoke to Ralph Fitzgerald and wife at bedside. Wife works at [employer] and was able to get access to Ralph Fitzgerald's notes. Per Dr. Fortunato Curling notes, "reports that he has never smoked. He has never used smokeless tobacco. He reports current alcohol use. He reports previous drug use." Ralph Fitzgerald and wife report that this statement is incorrect. Ralph Fitzgerald does have a tobacco hx but no drug hx. Ralph Fitzgerald wanted it documented in chart correctly.
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
## 2019-10-22 MED ORDER — SODIUM CHLORIDE 0.9 % IV SOLN
INTRAVENOUS | Status: DC | PRN
  Administered 2019-10-22 (×2): 250 mL via INTRAVENOUS

## 2019-10-22 NOTE — Progress Notes (Signed)
2 Days Post-Op   Subjective/Chief Complaint: Patient seen.  Wife at bedside.  Still relating some pain in his left heel but also along the front part of his ankle and foot.  States he was able to get some good sleep last night.   Objective: Vital signs in last 24 hours: Temp:  [98 F (36.7 C)-98.5 F (36.9 C)] 98 F (36.7 C) (04/11 0819) Pulse Rate:  [64-79] 68 (04/11 1024) Resp:  [16-19] 19 (04/11 0819) BP: (103-129)/(59-79) 112/79 (04/11 1024) SpO2:  [98 %-100 %] 100 % (04/11 1024) Last BM Date: 10/21/19  Intake/Output from previous day: 04/10 0701 - 04/11 0700 In: 650 [P.O.:600; IV Piggyback:50] Out: -  Intake/Output this shift: Total I/O In: 240 [P.O.:240] Out: -   Dressing is intact.  Upon removal there is less bleeding with minimal drainage on the bandaging.  Some continued erythema with minimal edema at the left heel.  A scant area of some drainage is noted along the middle portion of the previous laceration area.  No expressible purulence.  Lab Results:  Recent Labs    10/19/19 1754 10/20/19 0543  WBC 9.5 7.0  HGB 13.3 12.1*  HCT 39.4 34.6*  PLT 235 201   BMET Recent Labs    10/19/19 1754 10/20/19 0543  NA 137 136  K 4.0 4.1  CL 102 102  CO2 28 28  GLUCOSE 114* 84  BUN 19 19  CREATININE 1.04 1.04  CALCIUM 9.4 8.9   PT/INR No results for input(s): LABPROT, INR in the last 72 hours. ABG No results for input(s): PHART, HCO3 in the last 72 hours.  Invalid input(s): PCO2, PO2  Studies/Results: No results found.  Anti-infectives: Anti-infectives (From admission, onward)   Start     Dose/Rate Route Frequency Ordered Stop   10/19/19 2330  clindamycin (CLEOCIN) IVPB 600 mg     600 mg 100 mL/hr over 30 Minutes Intravenous Every 8 hours 10/19/19 2322     10/19/19 1900  vancomycin (VANCOCIN) IVPB 1000 mg/200 mL premix     1,000 mg 200 mL/hr over 60 Minutes Intravenous  Once 10/19/19 1850 10/19/19 2030      Assessment/Plan: s/p  Procedure(s): IRRIGATION AND DEBRIDEMENT FOOT (Left) Assessment: Status post I&D left heel   Plan: Betadine applied to the laceration and incision area with iodoform gauze packed into the open area followed by a bulky gauze bandage.  Discussed with the patient that he could be having pain from the initial injury on the top of his foot.  Discussed that MRI did show some increased uptake in the talonavicular area which most likely is trauma induced.  Gram stain intraoperatively showed gram-negative and positive rods.  We will plan on keeping him at least another day and hopefully sensitivities will be back.  If any increased purulence we did discuss the possibility of flushing out the wound again.  LOS: 3 days    Ricci Barker 10/22/2019

## 2019-10-22 NOTE — Plan of Care (Signed)
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
## 2019-10-23 MED ORDER — CIPROFLOXACIN HCL 500 MG PO TABS
500.0000 mg | ORAL_TABLET | Freq: Two times a day (BID) | ORAL | Status: DC
  Administered 2019-10-23: 500 mg via ORAL
  Filled 2019-10-23 (×3): qty 1

## 2019-10-23 MED ORDER — CLINDAMYCIN HCL 150 MG PO CAPS
600.0000 mg | ORAL_CAPSULE | Freq: Three times a day (TID) | ORAL | Status: DC
  Filled 2019-10-23 (×3): qty 4

## 2019-10-23 MED ORDER — CLINDAMYCIN HCL 300 MG PO CAPS: 600.0000 mg | ORAL_CAPSULE | Freq: Three times a day (TID) | ORAL | 0 refills | Status: AC

## 2019-10-23 MED ORDER — CIPROFLOXACIN HCL 500 MG PO TABS: 500.0000 mg | ORAL_TABLET | Freq: Two times a day (BID) | ORAL | 0 refills | Status: AC
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
## 2019-10-23 NOTE — Discharge Instructions (Signed)
Dressing instructions per Dr Alberteen Spindle Use Crutches while ambulating and keep of weight left foot

## 2019-10-23 NOTE — Progress Notes (Signed)
3 Days Post-Op   Subjective/Chief Complaint: Patient seen.  Doing some better as far as pain.   Objective: Vital signs in last 24 hours: Temp:  [98 F (36.7 C)-98.6 F (37 C)] 98 F (36.7 C) (04/12 0804) Pulse Rate:  [59-77] 77 (04/12 0804) Resp:  [15-16] 16 (04/11 2355) BP: (105-133)/(63-88) 133/88 (04/12 0804) SpO2:  [98 %-100 %] 98 % (04/12 0804) Last BM Date: 10/21/19  Intake/Output from previous day: 04/11 0701 - 04/12 0700 In: 650 [P.O.:600; IV Piggyback:50] Out: -  Intake/Output this shift: No intake/output data recorded.  Bandages dry and intact.  Upon removal there is just a small amount of blood and drainage.  Laceration and incision appear coapted with the exception of the open area for packing.  Erythema and edema stable.    Lab Results:  No results for input(s): WBC, HGB, HCT, PLT in the last 72 hours. BMET No results for input(s): NA, K, CL, CO2, GLUCOSE, BUN, CREATININE, CALCIUM in the last 72 hours. PT/INR No results for input(s): LABPROT, INR in the last 72 hours. ABG No results for input(s): PHART, HCO3 in the last 72 hours.  Invalid input(s): PCO2, PO2  Studies/Results: No results found.  Anti-infectives: Anti-infectives (From admission, onward)   Start     Dose/Rate Route Frequency Ordered Stop   10/23/19 1400  clindamycin (CLEOCIN) capsule 600 mg     600 mg Oral Every 8 hours 10/23/19 0830 10/29/19 1359   10/23/19 0845  ciprofloxacin (CIPRO) tablet 500 mg     500 mg Oral 2 times daily 10/23/19 0830 10/29/19 0759   10/19/19 2330  clindamycin (CLEOCIN) IVPB 600 mg  Status:  Discontinued     600 mg 100 mL/hr over 30 Minutes Intravenous Every 8 hours 10/19/19 2322 10/23/19 0830   10/19/19 1900  vancomycin (VANCOCIN) IVPB 1000 mg/200 mL premix     1,000 mg 200 mL/hr over 60 Minutes Intravenous  Once 10/19/19 1850 10/19/19 2030      Assessment/Plan: s/p Procedure(s): IRRIGATION AND DEBRIDEMENT FOOT (Left) Assessment: Stable status post I&D  left heel.   Plan: Saline wet-to-dry gauze packed within the wound with Betadine along the laceration incision line followed by a bulky bandage.  Patient will be nonweightbearing using crutches and at this point recommended that he does not need to wear his fracture boot.  Spoke with Dr. Allena Katz and should be stable for discharge on clindamycin and we will add Cipro.  Plan for follow-up outpatient in 1 week.  LOS: 4 days    Ricci Barker 10/23/2019

## 2019-10-23 NOTE — Progress Notes (Signed)
Pt is being discharged home.  Discharge papers given and explained to pt. Pt verbalized understanding. Meds and f/u appointments reviewed. Rx sent electronically to pharmacy. Pt made aware.  

## 2019-10-23 NOTE — Plan of Care (Signed)
  Problem: Education: Goal: Knowledge of General Education information will improve Description: Including pain rating scale, medication(s)/side effects and non-pharmacologic comfort measures Outcome: Progressing   Problem: Clinical Measurements: Goal: Ability to maintain clinical measurements within normal limits will improve Outcome: Progressing Goal: Will remain free from infection Outcome: Progressing Goal: Diagnostic test results will improve Outcome: Progressing Goal: Respiratory complications will improve Outcome: Progressing Goal: Cardiovascular complication will be avoided Outcome: Progressing   Problem: Pain Managment: Goal: General experience of comfort will improve Outcome: Progressing   Problem: Safety: Goal: Ability to remain free from injury will improve Outcome: Progressing   

## 2019-10-25 ENCOUNTER — Encounter: Payer: Self-pay | Admitting: *Deleted

## 2019-10-25 LAB — AEROBIC/ANAEROBIC CULTURE W GRAM STAIN (SURGICAL/DEEP WOUND)
Culture: NORMAL
Special Requests: NORMAL

## 2019-10-26 LAB — AEROBIC/ANAEROBIC CULTURE W GRAM STAIN (SURGICAL/DEEP WOUND)

## 2019-10-27 ENCOUNTER — Ambulatory Visit
Admission: RE | Admit: 2019-10-27 | Discharge: 2019-10-27 | Disposition: A | Discharge status: Home / Self Care | Payer: Managed Care, Other (non HMO) | Source: Ambulatory Visit | Attending: Student | Admitting: Student

## 2019-10-27 ENCOUNTER — Other Ambulatory Visit: Payer: Self-pay

## 2019-10-27 DIAGNOSIS — X58XXXA Exposure to other specified factors, initial encounter: Secondary | ICD-10-CM | POA: Insufficient documentation

## 2019-10-27 DIAGNOSIS — M7521 Bicipital tendinitis, right shoulder: Secondary | ICD-10-CM | POA: Diagnosis not present

## 2019-10-27 DIAGNOSIS — S42114A Nondisplaced fracture of body of scapula, right shoulder, initial encounter for closed fracture: Secondary | ICD-10-CM | POA: Diagnosis not present

## 2019-10-27 DIAGNOSIS — M7581 Other shoulder lesions, right shoulder: Secondary | ICD-10-CM

## 2019-10-27 IMAGING — MR MR SHOULDER*R* W/CM
6 series · 40 of 40 positions shown · IV contrast (agent unspecified)
Comparison: Report only from outside radiographs of the right
scapula [DATE]. Injection images today.

CLINICAL DATA: Shoulder pain with limited range of motion. History
of scapular fracture 1 year ago with recent re-injury. No previous
relevant surgery

EXAM:
MR ARTHROGRAM OF THE RIGHT SHOULDER
TECHNIQUE: Multiplanar, multisequence MR imaging of the right shoulder was
performed following the administration of intra-articular contrast.
CONTRAST:  See Injection Documentation.

[Series 5: T1 fat-sat · axial · right · 4.0mm · 0.55mm/px · z∈[-0,+120]mm · 6 of 25 slices shown (1 of 3)]
[im 1/25]
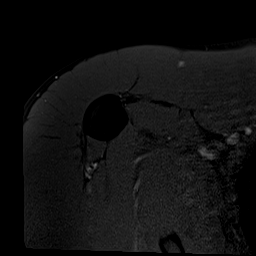
[im 5/25]
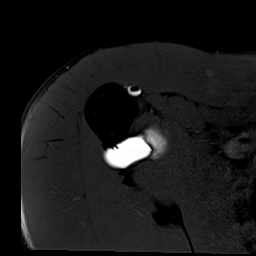
[im 10/25]
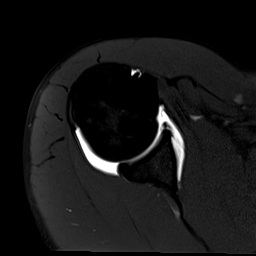
[im 15/25]
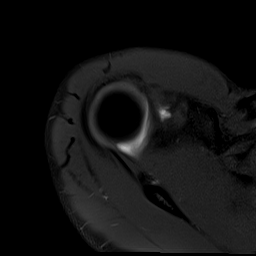
[im 20/25]
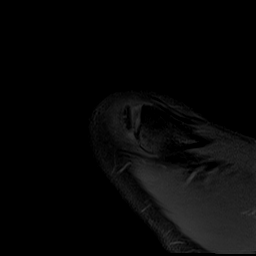
[im 25/25]
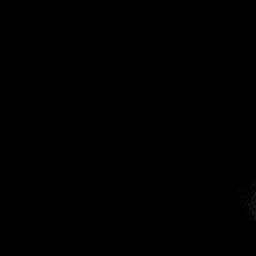

[Series 6: T1 fat-sat · oblique · right · 4.0mm · 0.55mm/px · 6 of 25 slices shown (2 of 3)]
[im 1/25]
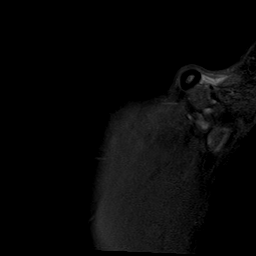
[im 5/25]
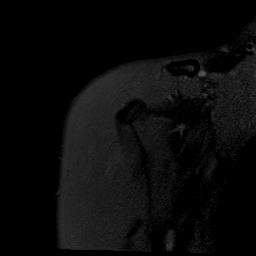
[im 10/25]
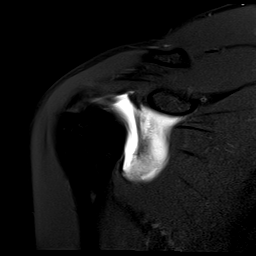
[im 15/25]
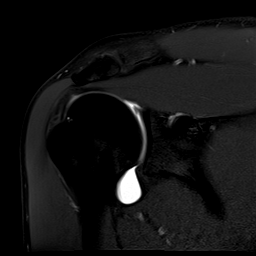
[im 20/25]
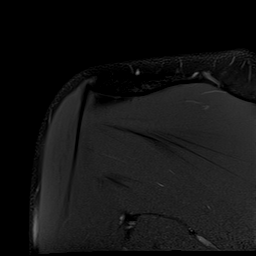
[im 25/25]
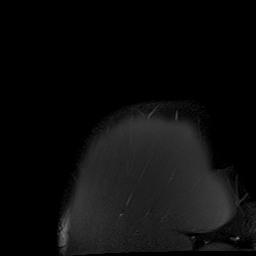

[Series 7: T2 fat-sat · oblique · right · 4.0mm · 0.55mm/px · 7 of 26 slices shown (1 of 2)]
[im 1/26]
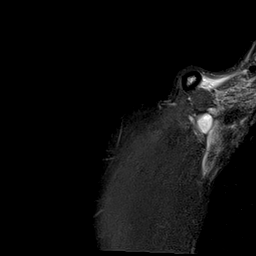
[im 5/26]
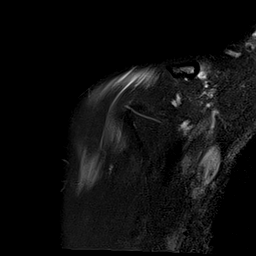
[im 9/26]
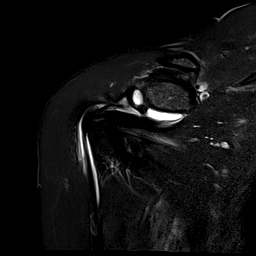
[im 13/26]
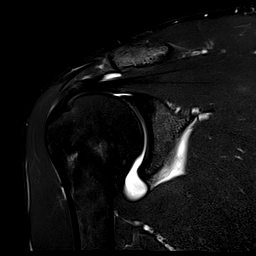
[im 17/26]
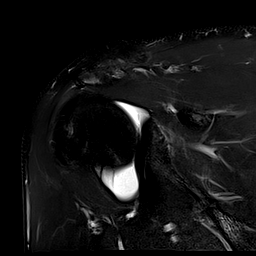
[im 21/26]
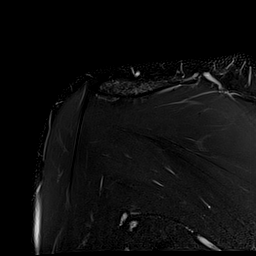
[im 26/26]
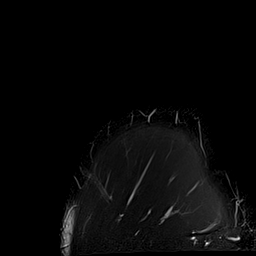

[Series 8: T1 · oblique · right · 4.0mm · 0.51mm/px · 7 of 26 slices shown]
[im 1/26]
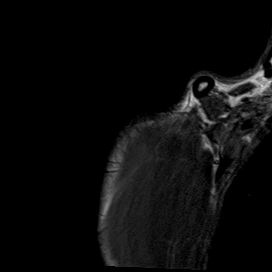
[im 5/26]
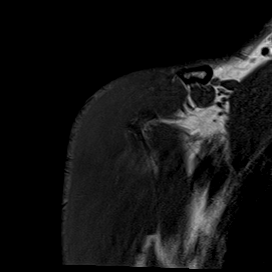
[im 9/26]
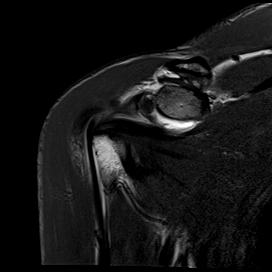
[im 13/26]
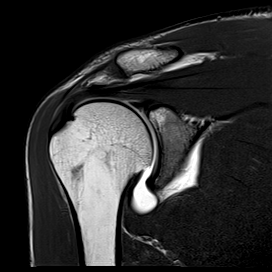
[im 17/26]
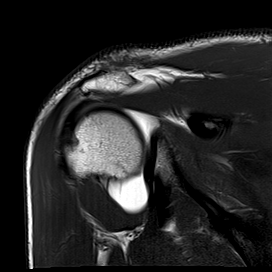
[im 21/26]
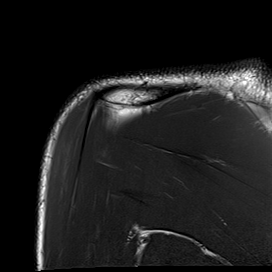
[im 26/26]
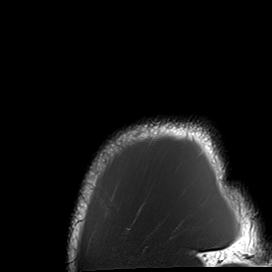

[Series 9: T2 fat-sat · oblique · right · 4.0mm · 0.55mm/px · 7 of 25 slices shown (2 of 2)]
[im 1/25]
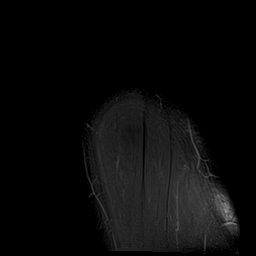
[im 5/25]
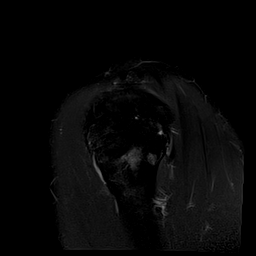
[im 9/25]
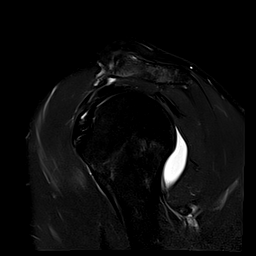
[im 13/25]
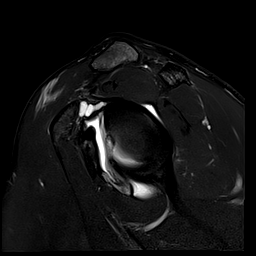
[im 17/25]
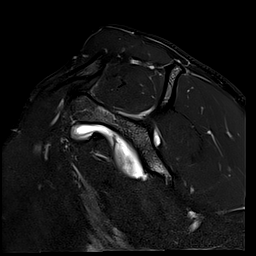
[im 21/25]
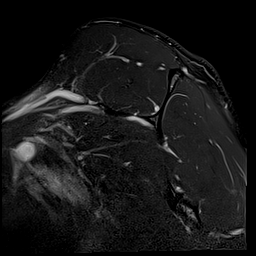
[im 25/25]
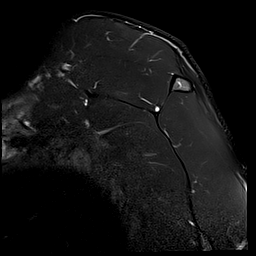

[Series 12: T1 fat-sat · sagittal · right · 4.0mm · 0.62mm/px · 7 of 26 slices shown (3 of 3)]
[im 1/26]
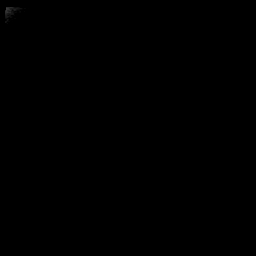
[im 5/26]
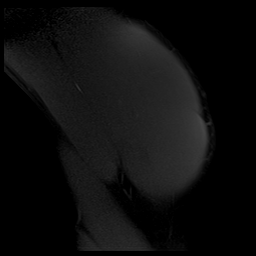
[im 9/26]
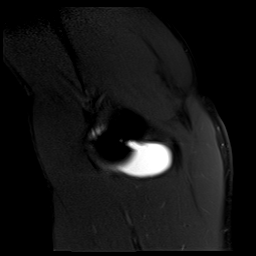
[im 13/26]
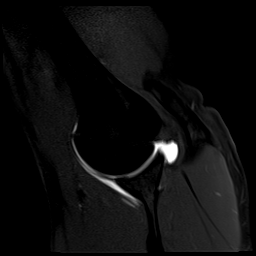
[im 17/26]
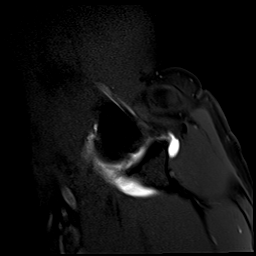
[im 21/26]
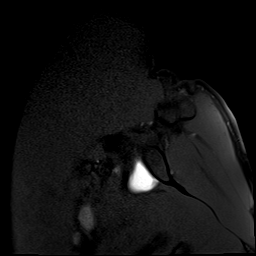
[im 26/26]
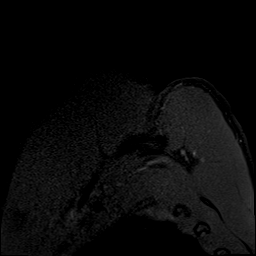

[40 of 40 positions shown; findings below may reference images not displayed]

FINDINGS: Rotator cuff:  Intact without significant tendinosis.

Muscles:  No focal muscular atrophy or edema.

Biceps long head:  Intact and normally positioned.

Acromioclavicular Joint: The acromion is type 1. No significant
abnormality of the acromioclavicular joint. There is no contrast or
other fluid in the subacromial-subdeltoid bursa.

Glenohumeral Joint: The shoulder joint is well distended with
contrast. No significant glenohumeral arthropathy.

Labrum: Extending anteriorly from the biceps anchor is a lobulated
cystic structure between the anterior glenoid and the coracoid
process. This measures up to 13 mm on sagittal image [DATE] and shows
T2 hyperintensity. There is no gadolinium within this structure.
This abuts the anterior superior labrum which shows a thin cleft
containing gadolinium on image [DATE], suspicious for an atypical
superior labral tear with associated paralabral cyst formation. The
labrum otherwise appears normal.

Bones: No evidence of acute fracture or dislocation. There is some
sclerosis along the lateral aspect of the scapular body (coronal
image [DATE] and axial image [DATE]) which may relate to prior scapular
fracture.

Other: No significant soft tissue findings.
IMPRESSION: 1. Lobulated cystic structure between the anterior glenoid and the
coracoid process, suspicious for an atypical superior labral tear
with associated paralabral cyst formation.
2. The rotator cuff and biceps tendon are intact.
3. Sclerosis of the scapular body laterally may relate to previously
questioned scapular fracture. No acute osseous findings.

## 2019-10-27 IMAGING — RF DG FLUORO GUIDE NDL PLC/BX
1 series · 1 of 1 positions shown · non-contrast
Comparison: none

CLINICAL DATA: Right scapular injury.  Tendinitis.

[Series 1: cp_standard · 0.18mm/px · 1 of 1 slices shown]
[im 1/1]
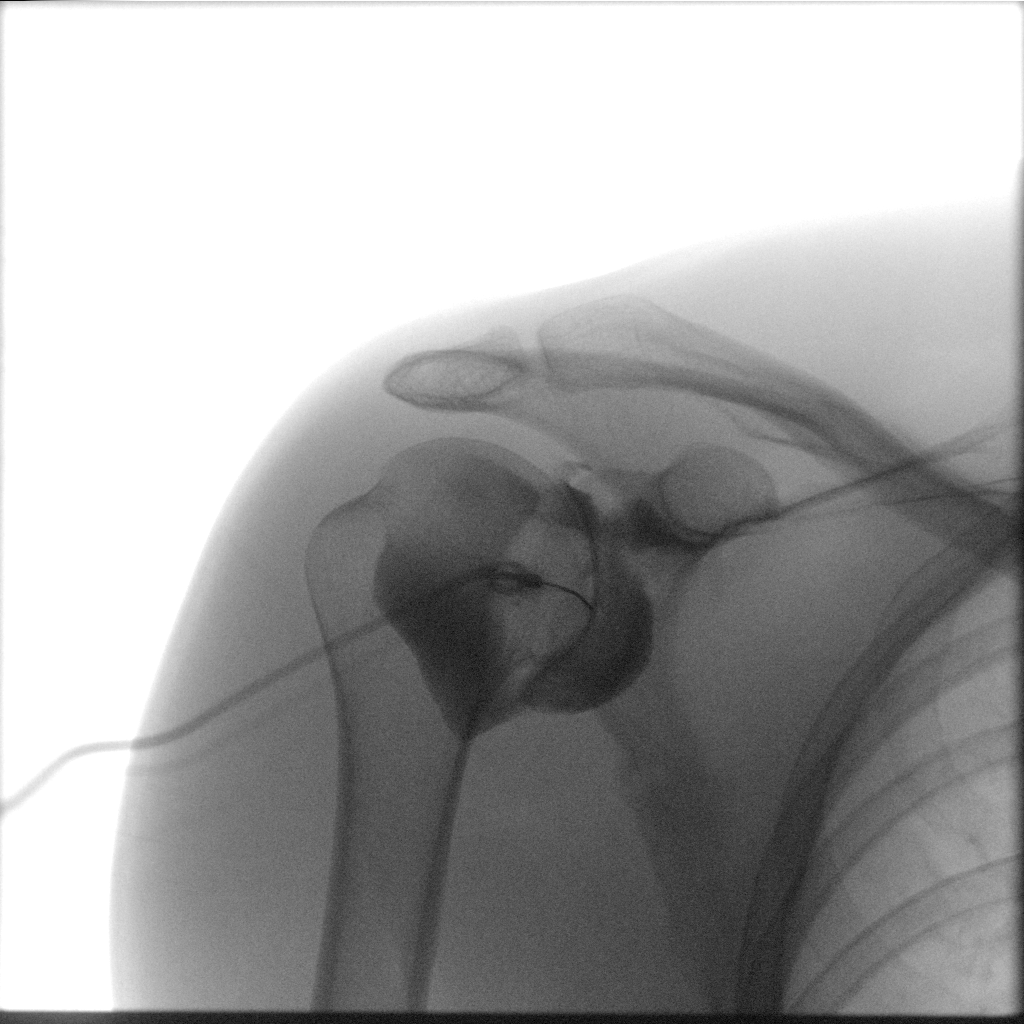

[1 of 1 positions shown; findings below may reference images not displayed]

EXAM:
RIGHT SHOULDER INJECTION UNDER FLUOROSCOPY

FLUOROSCOPY TIME:  Fluoroscopy Time:  0 minutes 48 seconds

Radiation Exposure Index (if provided by the fluoroscopic device):
2.3 mGy

PROCEDURE:
After discussing the risks and benefits of this procedure the
patient informed consent was obtained. Right shoulder sterilely
prepped and draped. Following local anesthesia with 1% lidocaine 22
gauge spinal needle was advanced into the right shoulder joint under
fluoroscopic guidance. Standardized mixture of nonionic contrast,
saline, and gadolinium administered. Needle withdrawn. Hemostasis
achieved. Patient sent to MRI in stable condition. No complications.
IMPRESSION: Successful right shoulder injection for right shoulder MRI
arthrography.

## 2019-10-27 MED ORDER — IOHEXOL 180 MG/ML  SOLN
20.0000 mL | Freq: Once | INTRAMUSCULAR | Status: AC | PRN
  Administered 2019-10-27: 15 mL via INTRA_ARTICULAR

## 2019-10-27 MED ORDER — LIDOCAINE HCL (PF) 1 % IJ SOLN
10.0000 mL | Freq: Once | INTRAMUSCULAR | Status: AC
  Administered 2019-10-27: 5 mL
  Filled 2019-10-27: qty 10

## 2019-10-27 MED ORDER — SODIUM CHLORIDE (PF) 0.9 % IJ SOLN
10.0000 mL | Freq: Once | INTRAMUSCULAR | Status: AC
  Administered 2019-10-27: 10 mL

## 2019-10-27 MED ORDER — GADOBUTROL 1 MMOL/ML IV SOLN
0.0500 mL | Freq: Once | INTRAVENOUS | Status: AC | PRN
  Administered 2019-10-27: 0.05 mL

## 2019-11-21 ENCOUNTER — Ambulatory Visit: Payer: Self-pay

## 2020-01-11 ENCOUNTER — Other Ambulatory Visit: Payer: Self-pay | Admitting: Internal Medicine

## 2020-01-11 DIAGNOSIS — M545 Low back pain, unspecified: Secondary | ICD-10-CM

## 2020-01-12 ENCOUNTER — Other Ambulatory Visit: Payer: Self-pay | Admitting: Internal Medicine

## 2020-01-12 ENCOUNTER — Other Ambulatory Visit: Payer: Self-pay

## 2020-01-12 ENCOUNTER — Ambulatory Visit
Admission: RE | Admit: 2020-01-12 | Discharge: 2020-01-12 | Disposition: A | Discharge status: Home / Self Care | Payer: Managed Care, Other (non HMO) | Source: Ambulatory Visit | Attending: Internal Medicine | Admitting: Internal Medicine

## 2020-01-12 DIAGNOSIS — M5442 Lumbago with sciatica, left side: Secondary | ICD-10-CM | POA: Insufficient documentation

## 2020-01-12 IMAGING — MR MR LUMBAR SPINE W/O CM
5 series · 31 of 48 positions shown · non-contrast
Comparison: Radiography [DATE]

CLINICAL DATA: Low back pain over the last 3 weeks, acutely
worsened today with pain radiating down the left leg.

EXAM:
MRI LUMBAR SPINE WITHOUT CONTRAST
TECHNIQUE: Multiplanar, multisequence MR imaging of the lumbar spine was
performed. No intravenous contrast was administered.

[Series 5: T2 · sagittal · 4.0mm · 0.81mm/px · 6 of 15 slices shown (1 of 2)]
[im 1/15]
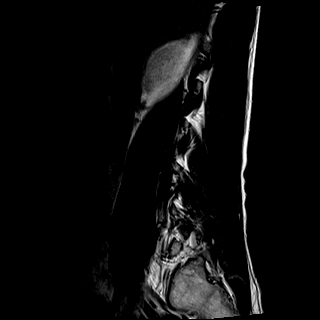
[im 3/15]
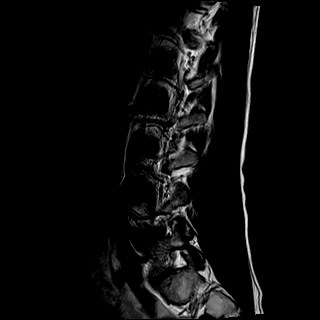
[im 6/15]
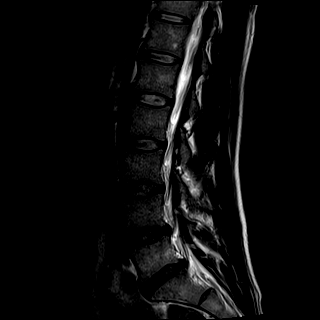
[im 9/15]
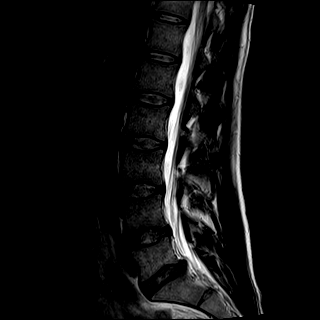
[im 12/15]
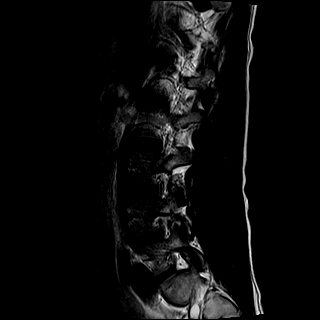
[im 15/15]
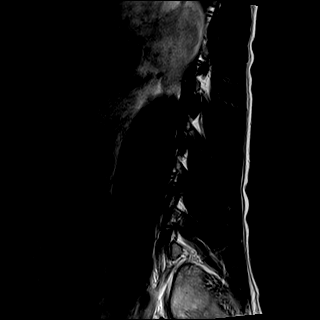

[Series 6: T1 · sagittal · 4.0mm · 0.81mm/px · 7 of 15 slices shown (1 of 2)]
[im 1/15]
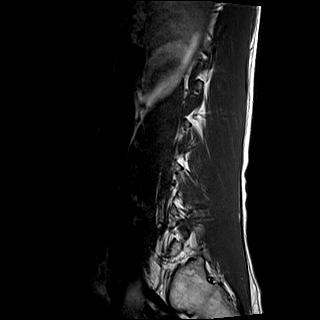
[im 3/15]
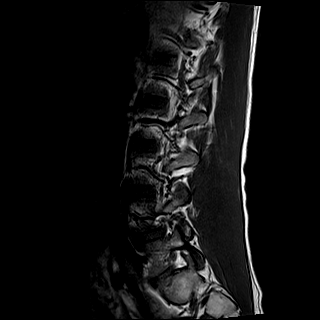
[im 5/15]
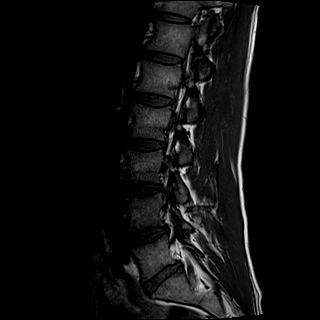
[im 8/15]
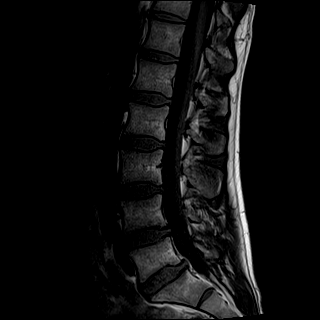
[im 10/15]
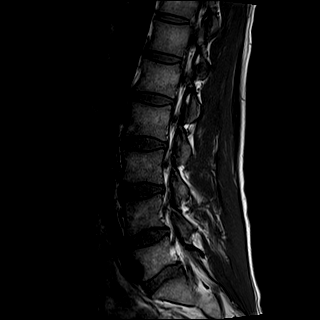
[im 12/15]
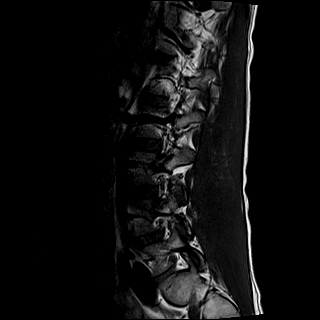
[im 15/15]
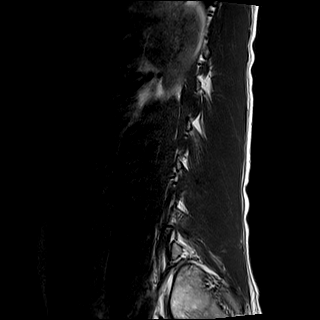

[Series 7: STIR · sagittal · 4.0mm · 0.41mm/px · 2 of 15 slices shown]
[im 1/15]
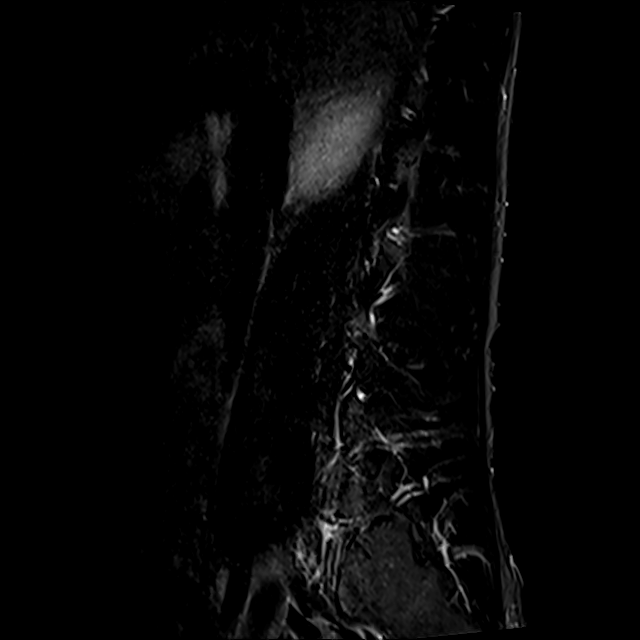
[im 3/15]
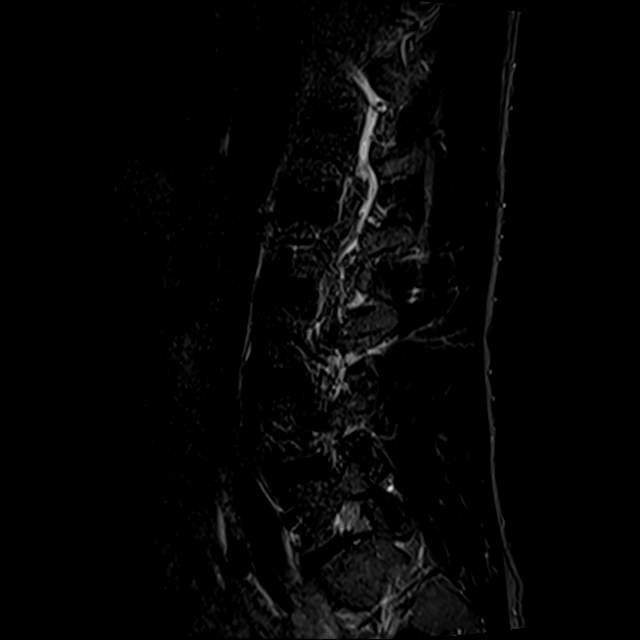

[Series 8: T2 · axial · 4.0mm · 0.78mm/px · z∈[-152,+39]mm · 8 of 31 slices shown (2 of 2)]
[im 1/31]
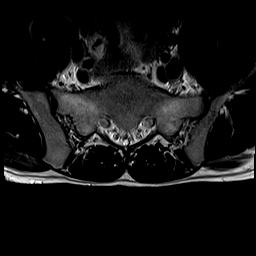
[im 5/31]
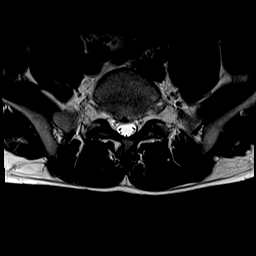
[im 10/31]
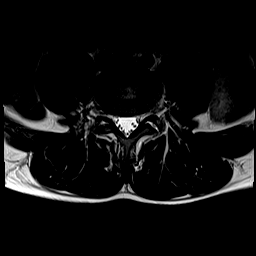
[im 14/31]
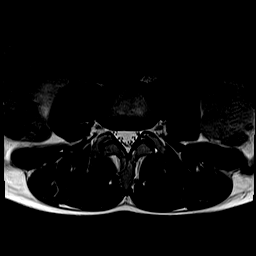
[im 17/31]
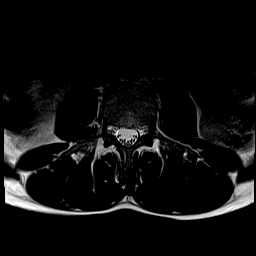
[im 21/31]
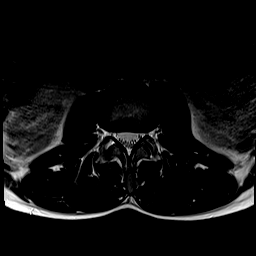
[im 26/31]
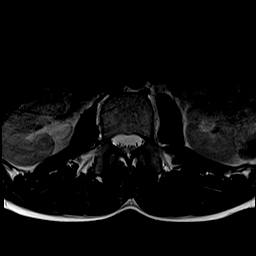
[im 31/31]
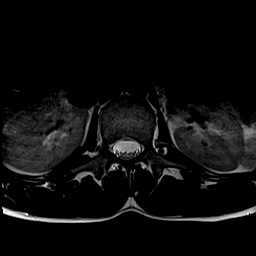

[Series 9: T1 · axial · 4.0mm · 0.39mm/px · z∈[-152,+39]mm · 8 of 31 slices shown (2 of 2)]
[im 1/31]
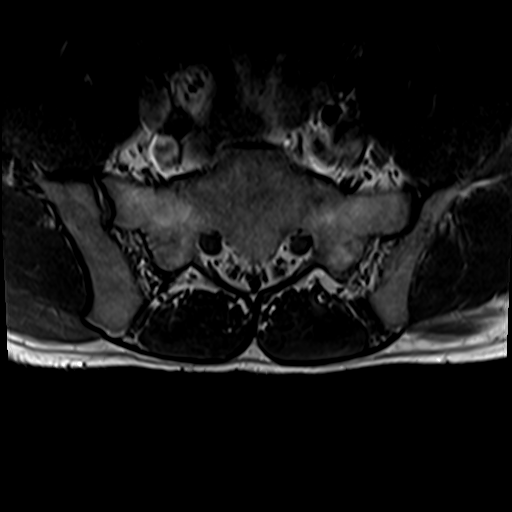
[im 5/31]
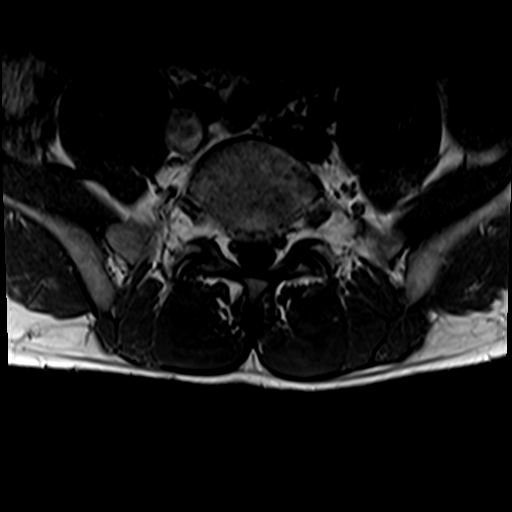
[im 10/31]
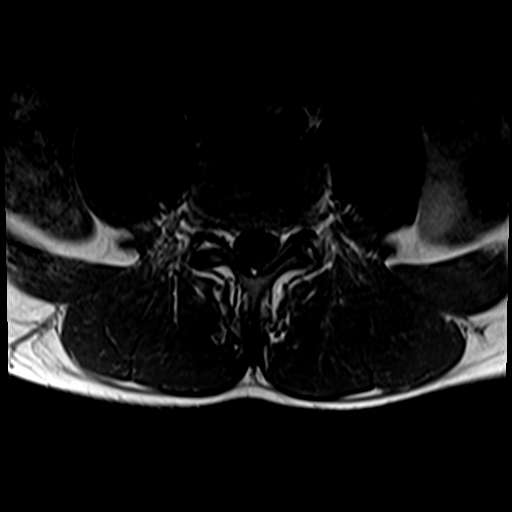
[im 14/31]
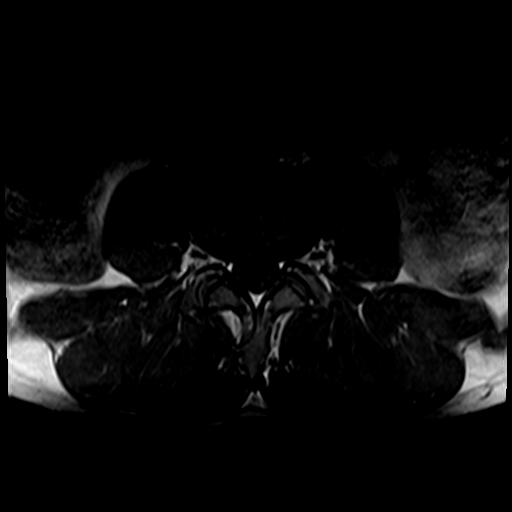
[im 17/31]
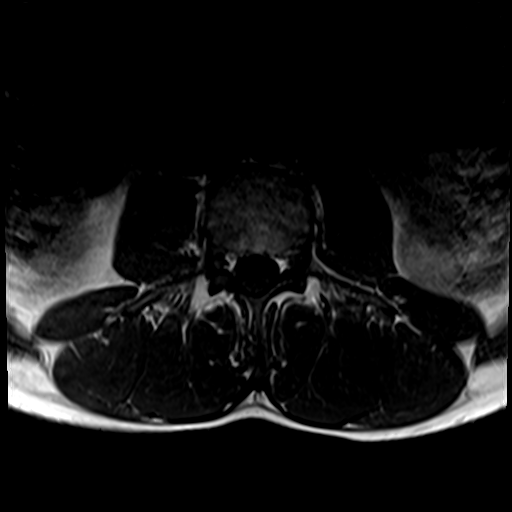
[im 21/31]
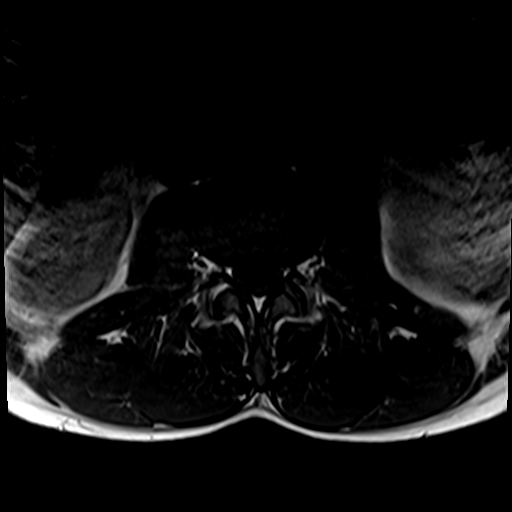
[im 26/31]
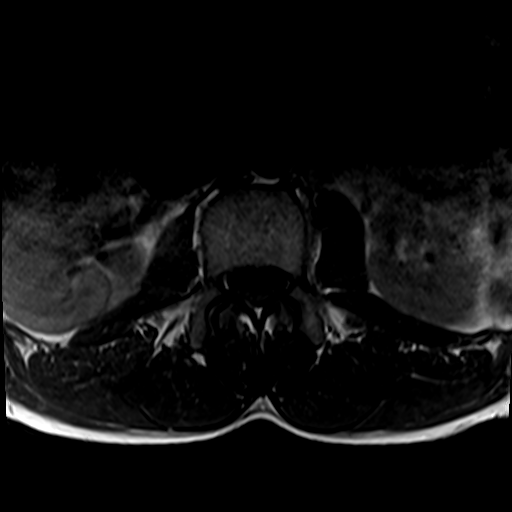
[im 31/31]
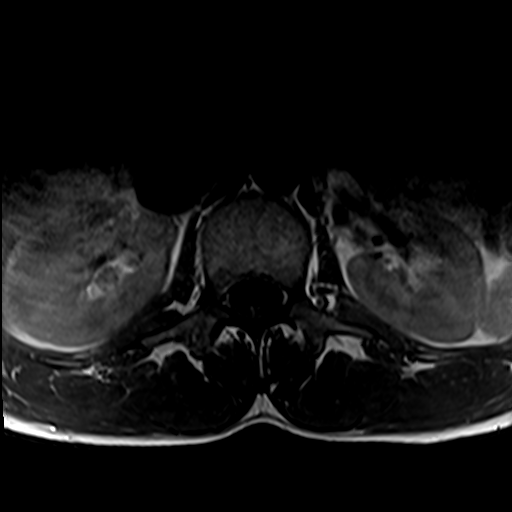

[31 of 48 positions shown; findings below may reference images not displayed]

FINDINGS: Segmentation:  5 lumbar type vertebral bodies.

Alignment:  Normal

Vertebrae:  Normal

Conus medullaris and cauda equina: Conus extends to the L1 level.
Conus and cauda equina appear normal.

Paraspinal and other soft tissues: Normal

Disc levels:

No abnormality at L3-4 or above.

At L4-5, the disc shows mild degeneration and annular bulging. No
apparent compressive stenosis.

At L5-S1, disc shows degeneration and AA central disc herniation
that contacts the ventral aspect of the thecal sac and both S1
nerves. This appears slightly more prominent on the right, but
either S1 nerve could be irritated.
IMPRESSION: L5-S1: Disc degeneration with a central disc herniation, slightly
indenting the thecal sac and contacting both S1 nerves, slightly
more so on the right. None the less, either S1 nerve could be
irritated.

L4-5: Disc degeneration with annular bulging but no compressive
stenosis.

## 2020-05-21 ENCOUNTER — Other Ambulatory Visit (HOSPITAL_COMMUNITY): Payer: Self-pay | Admitting: Internal Medicine

## 2020-05-21 ENCOUNTER — Other Ambulatory Visit: Payer: Self-pay | Admitting: Internal Medicine

## 2020-05-21 DIAGNOSIS — M25512 Pain in left shoulder: Secondary | ICD-10-CM

## 2020-05-21 DIAGNOSIS — G8929 Other chronic pain: Secondary | ICD-10-CM

## 2020-06-05 ENCOUNTER — Ambulatory Visit: Payer: Managed Care, Other (non HMO)

## 2020-06-13 ENCOUNTER — Other Ambulatory Visit: Payer: Self-pay | Admitting: Internal Medicine

## 2020-06-13 DIAGNOSIS — M25512 Pain in left shoulder: Secondary | ICD-10-CM

## 2020-06-13 DIAGNOSIS — G8929 Other chronic pain: Secondary | ICD-10-CM

## 2020-07-09 ENCOUNTER — Other Ambulatory Visit: Payer: Self-pay

## 2020-07-09 ENCOUNTER — Emergency Department
Admission: EM | Admit: 2020-07-09 | Discharge: 2020-07-09 | Disposition: A | Discharge status: Home / Self Care | Payer: Managed Care, Other (non HMO) | Attending: Emergency Medicine | Admitting: Emergency Medicine

## 2020-07-09 DIAGNOSIS — T1491XA Suicide attempt, initial encounter: Secondary | ICD-10-CM | POA: Diagnosis not present

## 2020-07-09 DIAGNOSIS — F10129 Alcohol abuse with intoxication, unspecified: Secondary | ICD-10-CM | POA: Diagnosis not present

## 2020-07-09 DIAGNOSIS — F411 Generalized anxiety disorder: Secondary | ICD-10-CM

## 2020-07-09 DIAGNOSIS — F10929 Alcohol use, unspecified with intoxication, unspecified: Secondary | ICD-10-CM

## 2020-07-09 DIAGNOSIS — F4325 Adjustment disorder with mixed disturbance of emotions and conduct: Secondary | ICD-10-CM | POA: Insufficient documentation

## 2020-07-09 DIAGNOSIS — Z046 Encounter for general psychiatric examination, requested by authority: Secondary | ICD-10-CM | POA: Diagnosis present

## 2020-07-09 DIAGNOSIS — F32A Depression, unspecified: Secondary | ICD-10-CM

## 2020-07-09 DIAGNOSIS — Z20822 Contact with and (suspected) exposure to covid-19: Secondary | ICD-10-CM | POA: Diagnosis not present

## 2020-07-09 DIAGNOSIS — X838XXA Intentional self-harm by other specified means, initial encounter: Secondary | ICD-10-CM | POA: Diagnosis not present

## 2020-07-09 DIAGNOSIS — F1994 Other psychoactive substance use, unspecified with psychoactive substance-induced mood disorder: Secondary | ICD-10-CM

## 2020-07-09 DIAGNOSIS — Y92810 Car as the place of occurrence of the external cause: Secondary | ICD-10-CM | POA: Diagnosis not present

## 2020-07-09 DIAGNOSIS — F1924 Other psychoactive substance dependence with psychoactive substance-induced mood disorder: Secondary | ICD-10-CM | POA: Insufficient documentation

## 2020-07-09 LAB — COMPREHENSIVE METABOLIC PANEL
ALT: 24 U/L (ref 0–44)
AST: 26 U/L (ref 15–41)
Albumin: 4.8 g/dL (ref 3.5–5.0)
Alkaline Phosphatase: 53 U/L (ref 38–126)
Anion gap: 10 (ref 5–15)
BUN: 16 mg/dL (ref 6–20)
CO2: 30 mmol/L (ref 22–32)
Calcium: 9.8 mg/dL (ref 8.9–10.3)
Chloride: 104 mmol/L (ref 98–111)
Creatinine, Ser: 0.94 mg/dL (ref 0.61–1.24)
GFR, Estimated: >60 mL/min (ref >60)
Glucose, Bld: 120 mg/dL — ABNORMAL HIGH (ref 70–99)
Potassium: 4.6 mmol/L (ref 3.5–5.1)
Sodium: 144 mmol/L (ref 135–145)
Total Bilirubin: 0.7 mg/dL (ref 0.3–1.2)
Total Protein: 8 g/dL (ref 6.5–8.1)

## 2020-07-09 LAB — URINE DRUG SCREEN, QUALITATIVE (ARMC ONLY)
Amphetamines, Ur Screen: NOT DETECTED
Barbiturates, Ur Screen: NOT DETECTED
Benzodiazepine, Ur Scrn: POSITIVE — AB
Cannabinoid 50 Ng, Ur ~~LOC~~: POSITIVE — AB
Cocaine Metabolite,Ur ~~LOC~~: NOT DETECTED
MDMA (Ecstasy)Ur Screen: NOT DETECTED
Methadone Scn, Ur: NOT DETECTED
Opiate, Ur Screen: POSITIVE — AB
Phencyclidine (PCP) Ur S: NOT DETECTED
Tricyclic, Ur Screen: NOT DETECTED

## 2020-07-09 LAB — URINALYSIS, COMPLETE (UACMP) WITH MICROSCOPIC
Bacteria, UA: NONE SEEN
Bilirubin Urine: NEGATIVE
Glucose, UA: NEGATIVE mg/dL
Hgb urine dipstick: NEGATIVE
Ketones, ur: NEGATIVE mg/dL
Leukocytes,Ua: NEGATIVE
Nitrite: NEGATIVE
Protein, ur: NEGATIVE mg/dL
Specific Gravity, Urine: 1.016 (ref 1.005–1.030)
Squamous Epithelial / HPF: NONE SEEN (ref 0–5)
pH: 7 (ref 5.0–8.0)

## 2020-07-09 LAB — CBC
HCT: 41.9 % (ref 39.0–52.0)
Hemoglobin: 14.2 g/dL (ref 13.0–17.0)
MCH: 32.4 pg (ref 26.0–34.0)
MCHC: 33.9 g/dL (ref 30.0–36.0)
MCV: 95.7 fL (ref 80.0–100.0)
Platelets: 259 10*3/uL (ref 150–400)
RBC: 4.38 MIL/uL (ref 4.22–5.81)
RDW: 13 % (ref 11.5–15.5)
WBC: 7.2 10*3/uL (ref 4.0–10.5)
nRBC: 0 % (ref 0.0–0.2)

## 2020-07-09 LAB — SARS CORONAVIRUS 2 (TAT 6-24 HRS): SARS Coronavirus 2: NEGATIVE

## 2020-07-09 LAB — COOXEMETRY PANEL
Carboxyhemoglobin: 1.4 % (ref 0.5–1.5)
Methemoglobin: 0.4 % (ref 0.0–1.5)
O2 Saturation: 96.9 %
Total oxygen content: 97.6 mL/dL

## 2020-07-09 LAB — ETHANOL: Alcohol, Ethyl (B): 148 mg/dL — ABNORMAL HIGH (ref <10)
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
## 2020-07-09 NOTE — ED Notes (Signed)
Breakfast tray given. °

## 2020-07-09 NOTE — ED Notes (Signed)
Dr. Paduchowski, MD at bedside.  

## 2020-07-09 NOTE — ED Triage Notes (Signed)
Pt brought in under IVC denies any SI or HI at this time.

## 2020-07-09 NOTE — ED Notes (Addendum)
Pt dressed out Socks Jeans [employer] wedding band  All locked in locked unit

## 2020-07-09 NOTE — Discharge Instructions (Addendum)
You have been seen in the emergency department for a  psychiatric concern. You have been evaluated both medically as well as psychiatrically. Please follow-up with your outpatient resources provided. Return to the emergency department for any worsening symptoms, or any thoughts of hurting yourself or anyone else so that we may attempt to help you. 

## 2020-07-09 NOTE — Consult Note (Signed)
[facility] Face-to-Face Psychiatry Consult   Reason for Consult: Consult for this 34 year old man brought in by law enforcement under IVC last night Referring Physician: Paduchowski Patient Identification: KIANTE CIAVARELLA MRN:  [id number] Principal Diagnosis: Adjustment disorder with mixed disturbance of emotions and conduct Diagnosis:  Principal Problem:   Adjustment disorder with mixed disturbance of emotions and conduct Active Problems:   Substance induced mood disorder ([place])   Generalized anxiety disorder   Alcohol intoxication ([place])   Total Time spent with patient: 1 hour  Subjective:   FREEDOM PEDDY is a 34 y.o. male patient admitted with "I do not remember.  I must have blacked out".  HPI: Patient seen chart reviewed.  Also spoke with the patient's wife on the telephone.  15 year old man brought by law enforcement after they were called to his home.  The patient's mother called him because he was out in the driveway rigging a pipe up to the exhaust of his car.  When law enforcement came he had a pipe reading from the exhaust of his car to the inside of it but was not sitting in the car.  Reportedly he told police that he wanted to "end it".  On interview this morning the patient says he does not remember hooking up the hose and does not remember saying anything about being suicidal.  He does remember being brought over to the hospital but is not even clear on whether it was the police that did it.  He says he had been drinking last night with his mother.  He says he had consumed approximately 4 beers which is a little more than what he normally would drink.  Blood alcohol level on presentation was 146.  Denied that he was abusing substances.  Drug screen is positive for benzodiazepines cannabis and opiates.  Patient says he has been under a lot of stress recently.  His mother is staying with him temporarily which is stressful.  He also says his father from whom he had previously been estranged  is "back in my life" and that is been keeping him nervous.  He has chronic anxiety problems and takes clonazepam regularly from his primary care doctor.  On interview this morning the patient is calm and lucid does not appear agitated or tearful.  He denies having any suicidal thoughts at all.  Denies homicidal thoughts denies any psychotic symptoms.  Wife by telephone and says she does not have any concern about his safety.  He had never made any suicidal comments to her and she had not seen him be particularly distressed recently.  Past Psychiatric History: No prior admissions.  No prior suicide attempts.  Has been treated by his primary care doctor for anxiety and takes clonazepam regularly  Risk to Self:   Risk to Others:   Prior Inpatient Therapy:   Prior Outpatient Therapy:    Past Medical History: No past medical history on file.  Past Surgical History:  Procedure Laterality Date  . IRRIGATION AND DEBRIDEMENT FOOT Left 10/20/2019   Procedure: IRRIGATION AND DEBRIDEMENT FOOT;  Surgeon: Sharlotte Alamo, DPM;  Location: [facility] ORS;  Service: Podiatry;  Laterality: Left;   Family History: No family history on file. Family Psychiatric  History: None reported Social History:  Social History   Substance and Sexual Activity  Alcohol Use Yes   Comment: occasional     Social History   Substance and Sexual Activity  Drug Use Not Currently    Social History   Socioeconomic History  .  Marital status: Married    Spouse name: Not on file  . Number of children: Not on file  . Years of education: Not on file  . Highest education level: Not on file  Occupational History  . Not on file  Tobacco Use  . Smoking status: Never Smoker  . Smokeless tobacco: Never Used  Vaping Use  . Vaping Use: Some days  . Substances: Flavoring  Substance and Sexual Activity  . Alcohol use: Yes    Comment: occasional  . Drug use: Not Currently  . Sexual activity: Not on file  Other Topics Concern  . Not on  file  Social History Narrative  . Not on file   Social Determinants of Health   Financial Resource Strain: Not on file  Food Insecurity: Not on file  Transportation Needs: Not on file  Physical Activity: Not on file  Stress: Not on file  Social Connections: Not on file   Additional Social History:    Allergies:   Allergies  Allergen Reactions  . Penicillins Hives  . Diclofenac Rash    Labs:  Results for orders placed or performed during the hospital encounter of 07/09/20 (from the past 48 hour(s))  CBC     Status: None   Collection Time: 07/09/20  5:13 AM  Result Value Ref Range   WBC 7.2 4.0 - 10.5 K/uL   RBC 4.38 4.22 - 5.81 MIL/uL   Hemoglobin 14.2 13.0 - 17.0 g/dL   HCT 41.9 39.0 - 52.0 %   MCV 95.7 80.0 - 100.0 fL   MCH 32.4 26.0 - 34.0 pg   MCHC 33.9 30.0 - 36.0 g/dL   RDW 13.0 11.5 - 15.5 %   Platelets 259 150 - 400 K/uL   nRBC 0.0 0.0 - 0.2 %    Comment: Performed at [facility], [address] [place], [place] [address]  Comprehensive metabolic panel     Status: Abnormal   Collection Time: 07/09/20  5:13 AM  Result Value Ref Range   Sodium 144 135 - 145 mmol/L   Potassium 4.6 3.5 - 5.1 mmol/L   Chloride 104 98 - 111 mmol/L   CO2 30 22 - 32 mmol/L   Glucose, Bld 120 (H) 70 - 99 mg/dL    Comment: Glucose reference range applies only to samples taken after fasting for at least 8 hours.   BUN 16 6 - 20 mg/dL   Creatinine, Ser 0.94 0.61 - 1.24 mg/dL   Calcium 9.8 8.9 - 10.3 mg/dL   Total Protein 8.0 6.5 - 8.1 g/dL   Albumin 4.8 3.5 - 5.0 g/dL   AST 26 15 - 41 U/L   ALT 24 0 - 44 U/L   Alkaline Phosphatase 53 38 - 126 U/L   Total Bilirubin 0.7 0.3 - 1.2 mg/dL   GFR, Estimated >60 >60 mL/min    Comment: (NOTE) Calculated using the CKD-EPI Creatinine Equation (2021)    Anion gap 10 5 - 15    Comment: Performed at [facility], [address], [place], [place] [address]  Ethanol     Status: Abnormal   Collection Time: 07/09/20   5:13 AM  Result Value Ref Range   Alcohol, Ethyl (B) 148 (H) <10 mg/dL    Comment: (NOTE) Lowest detectable limit for serum alcohol is 10 mg/dL.  For medical purposes only. Performed at [facility], [address] [place], [place] [address]   .Cooxemetry Panel (carboxy, met, total hgb, O2 sat)  Status: None   Collection Time: 07/09/20  7:35 AM  Result Value Ref Range   O2 Saturation 96.9 %   Carboxyhemoglobin 1.4 0.5 - 1.5 %   Methemoglobin 0.4 0.0 - 1.5 %   Total oxygen content 97.6 mL/dL    Comment: Performed at [facility], [address] [place], [place] [address]  Urinalysis, Complete w Microscopic Nasopharyngeal Swab     Status: Abnormal   Collection Time: 07/09/20  8:47 AM  Result Value Ref Range   Color, Urine YELLOW (A) YELLOW   APPearance CLEAR (A) CLEAR   Specific Gravity, Urine 1.016 1.005 - 1.030   pH 7.0 5.0 - 8.0   Glucose, UA NEGATIVE NEGATIVE mg/dL   Hgb urine dipstick NEGATIVE NEGATIVE   Bilirubin Urine NEGATIVE NEGATIVE   Ketones, ur NEGATIVE NEGATIVE mg/dL   Protein, ur NEGATIVE NEGATIVE mg/dL   Nitrite NEGATIVE NEGATIVE   Leukocytes,Ua NEGATIVE NEGATIVE   RBC / HPF 0-5 0 - 5 RBC/hpf   WBC, UA 0-5 0 - 5 WBC/hpf   Bacteria, UA NONE SEEN NONE SEEN   Squamous Epithelial / LPF NONE SEEN 0 - 5   Mucus PRESENT     Comment: Performed at [facility], [address], [place], [place] [address]  Urine Drug Screen, Qualitative ([facility] only)     Status: Abnormal   Collection Time: 07/09/20  8:47 AM  Result Value Ref Range   Tricyclic, Ur Screen NONE DETECTED NONE DETECTED   Amphetamines, Ur Screen NONE DETECTED NONE DETECTED   MDMA (Ecstasy)Ur Screen NONE DETECTED NONE DETECTED   Cocaine Metabolite,Ur [place] NONE DETECTED NONE DETECTED   Opiate, Ur Screen POSITIVE (A) NONE DETECTED   Phencyclidine (PCP) Ur S NONE DETECTED NONE DETECTED   Cannabinoid 50 Ng, Ur [place] POSITIVE (A) NONE DETECTED   Barbiturates, Ur Screen NONE DETECTED  NONE DETECTED   Benzodiazepine, Ur Scrn POSITIVE (A) NONE DETECTED   Methadone Scn, Ur NONE DETECTED NONE DETECTED    Comment: (NOTE) Tricyclics + metabolites, urine    Cutoff 1000 ng/mL Amphetamines + metabolites, urine  Cutoff 1000 ng/mL MDMA (Ecstasy), urine              Cutoff 500 ng/mL Cocaine Metabolite, urine          Cutoff 300 ng/mL Opiate + metabolites, urine        Cutoff 300 ng/mL Phencyclidine (PCP), urine         Cutoff 25 ng/mL Cannabinoid, urine                 Cutoff 50 ng/mL Barbiturates + metabolites, urine  Cutoff 200 ng/mL Benzodiazepine, urine              Cutoff 200 ng/mL Methadone, urine                   Cutoff 300 ng/mL  The urine drug screen provides only a preliminary, unconfirmed analytical test result and should not be used for non-medical purposes. Clinical consideration and professional judgment should be applied to any positive drug screen result due to possible interfering substances. A more specific alternate chemical method must be used in order to obtain a confirmed analytical result. Gas chromatography / mass spectrometry (GC/MS) is the preferred confirm atory method. Performed at [facility], [address] [place],  [address]     No current facility-administered medications for this encounter.   Current Outpatient Medications  Medication Sig Dispense Refill  . celecoxib (CELEBREX) 200  MG capsule Take 200 mg by mouth 2 (two) times daily.    . clonazePAM (KLONOPIN) 1 MG tablet Take 1 mg by mouth 2 (two) times daily as needed.    . Multiple Vitamin (MULTIVITAMIN) tablet Take 1 tablet by mouth daily.    Marland Kitchen oxyCODONE-acetaminophen (PERCOCET) 10-325 MG tablet Take 1 tablet by mouth 3 (three) times daily as needed.    . pantoprazole (PROTONIX) 40 MG tablet Take 40 mg by mouth daily.      Musculoskeletal: Strength & Muscle Tone: within normal limits Gait & Station: normal Patient leans: N/A  Psychiatric Specialty  Exam: Physical Exam Vitals and nursing note reviewed.  Constitutional:      Appearance: He is well-developed and well-nourished.  HENT:     Head: Normocephalic and atraumatic.  Eyes:     Conjunctiva/sclera: Conjunctivae normal.     Pupils: Pupils are equal, round, and reactive to light.  Cardiovascular:     Heart sounds: Normal heart sounds.  Pulmonary:     Effort: Pulmonary effort is normal.  Abdominal:     Palpations: Abdomen is soft.  Musculoskeletal:        General: Normal range of motion.     Cervical back: Normal range of motion.  Skin:    General: Skin is warm and dry.  Neurological:     General: No focal deficit present.     Mental Status: He is alert.  Psychiatric:        Attention and Perception: Attention normal.        Mood and Affect: Affect is blunt.        Speech: Speech normal.        Behavior: Behavior is slowed.        Thought Content: Thought content is not paranoid or delusional. Thought content does not include homicidal or suicidal ideation.        Cognition and Memory: Memory is impaired.        Judgment: Judgment normal.     Review of Systems  Constitutional: Negative.   HENT: Negative.   Eyes: Negative.   Respiratory: Negative.   Cardiovascular: Negative.   Gastrointestinal: Negative.   Musculoskeletal: Negative.   Skin: Negative.   Neurological: Negative.   Psychiatric/Behavioral: Positive for behavioral problems. The patient is nervous/anxious.     Blood pressure (!) 145/93, pulse (!) 116, temperature 98.5 F (36.9 C), temperature source Oral, resp. rate 20, height 6' (1.829 m), weight 77.1 kg, SpO2 97 %.Body mass index is 23.06 kg/m.  General Appearance: Casual  Eye Contact:  Good  Speech:  Clear and Coherent  Volume:  Normal  Mood:  Euthymic  Affect:  Constricted  Thought Process:  Coherent  Orientation:  Full (Time, Place, and Person)  Thought Content:  Logical  Suicidal Thoughts:  No  Homicidal Thoughts:  No  Memory:   Immediate;   Fair Recent;   Fair Remote;   Fair  Judgement:  Fair  Insight:  Shallow  Psychomotor Activity:  Decreased  Concentration:  Concentration: Fair  Recall:  [employer] of Knowledge:  Fair  Language:  Fair  Akathisia:  No  Handed:  Right  AIMS (if indicated):     Assets:  Desire for Improvement Housing Physical Health Resilience Social Support  ADL's:  Intact  Cognition:  WNL  Sleep:        Treatment Plan Summary: Plan Patient came in intoxicated also with a positive drug screen.  He is prescribed oxycodone and clonazepam neither of  which usually show up in the drug screen which makes me suspect he might have taken additional benzodiazepines and pain medicines as well.  Also positive for cannabis which she had denied.  Patient presumably was intoxicated in a way that is not normal for him.  He now claims to have blacked out the whole episode.  He is calm and denies suicidal thought.  Wife feels comfortable with discharge home.  Education performed with both patient and wife about dangerousness of mixing alcohol with prescription drugs and about treatment of depression.  Referral information will be provided encouraging them to look into outpatient therapy and medication management.  No new prescriptions required.  IVC will be discontinued and the patient can be discharged at the discretion of the ER physician.  Disposition: No evidence of imminent risk to self or others at present.   Patient does not meet criteria for psychiatric inpatient admission. Supportive therapy provided about ongoing stressors. Discussed crisis plan, support from social network, calling 911, coming to the Emergency Department, and calling Suicide Hotline.  Alethia Berthold, MD 07/09/2020 11:53 AM

## 2020-07-09 NOTE — ED Notes (Signed)
Pt given phone at this time to get a ride home.

## 2020-07-09 NOTE — BH Assessment (Signed)
Comprehensive Clinical Assessment (CCA) Note  07/09/2020 Ralph FolkStephen C Fitzgerald [id number]   Ralph PellegriniStephen Fitzgerald is a 34 y.o. male who [facility] ED involuntarily for treatment. Per triage note, Upon RN reviewing IVC paperwork it appears pt had rigged a hose from the tail pipe of a running vehicle into the windows of the vehicle. When [employer] responded to scene pt was not inside the vehicle but was standing near it. When LEO asked what pt was doing, pt stated "trying to end it". Pt denies SI/HI to triage staff. Triage staff reported pt was agitated, and very verbal toward officers that were present with pt when he arrived.   During TTS assessment pt presents alert, calm and oriented x 3, restless but cooperative, and mood-congruent with affect. The pt does not appear to be responding to internal or external stimuli. Neither is the pt presenting with any delusional thinking. Pt was unable to verify the information provided to triage RN, stating "I don't remember much besides sitting in the driver seat of the car in the driveway before I blacked out". Pt confirmed mom contacting [employer] but expressed to be unsure why. Pt identified his main complaint to be increased anxiety. Pt identified rapid heart rate as his primary symptom for the last few days. 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 Pt identified increased personal stressors due to his mother recently moving into the home and recent reunification with his biological father. Pt denies any current depression symptoms but confirmed a MH hx of anxiety. Pt was unclear regarding his compliance with medications and denies an OPT/INPT hx. Pt reports daily use of alcohol, "typically 6 pack of beer a week" but expressed drinking more than his usual last night causing him to behave erratically. Pt denies a hx of SA and refused the need for any resources. Pt reports a family hx of SA (Father) and to be unsure of any MH hx.  Pt reports a medical hx of pancreatitis but denies any current medical concerns. Pt denies a hx of SI/HI/AH/VH or to currently endorse any SI/HI/AH/VH and provided his wife (Amy Eckrich [phone]) as collateral.   Collateral contact made by Dr. Toni Amendlapacs  Per Dr. Toni Amendlapacs pt does not meet criteria for INPT  Chief Complaint:  Chief Complaint  Patient presents with  . Psychiatric Evaluation   Visit Diagnosis: Adjustment disorder with mixed disturbance of emotions and conduct  CCA Screening, Triage and Referral (STR)  Patient Reported Information How did you hear about us? Other (Comment)  Referral name: IVC  Referral phone number: No data recorded  Whom do you see for routine medical problems? No data recorded Practice/Facility Name: No data recorded Practice/Facility Phone Number: No data recorded Name of Contact: No data recorded Contact Number: No data recorded Contact Fax Number: No data recorded Prescriber Name: No data recorded Prescriber Address (if known): No data recorded  What Is the Reason for Your Visit/Call Today? No data recorded How Long Has This Been Causing You Problems? -- (Today)  What Do You Feel Would Help You the Most Today? Other (Comment) (Pt denies any need for assistance)   Have You Recently Been in Any Inpatient Treatment (Hospital/Detox/Crisis Center/28-Day Program)? No  Name/Location of Program/Hospital:No data recorded How Long Were You There? No data recorded When Were You Discharged? No data recorded  Have You Ever Received Services [facility] Before? Yes  [facility]   Have You Recently Had Any Thoughts About Hurting Yourself? No  Are You Planning to Commit Suicide/Harm Yourself At This time? No   Have you Recently Had Thoughts About Hurting Someone Karolee Ohs? No  Explanation: No data recorded  Have You Used Any Alcohol or Drugs in the Past 24 Hours? Yes  How Long Ago Did You Use Drugs or  Alcohol? 0000 (07/08/20)  What Did You Use and How Much? 4 beers   Do You Currently Have a Therapist/Psychiatrist? No  Name of Therapist/Psychiatrist: No data recorded  Have You Been Recently Discharged From Any Office Practice or Programs? No  Explanation of Discharge From Practice/Program: No data recorded    CCA Screening Triage Referral Assessment Type of Contact: Face-to-Face  Is this Initial or Reassessment? No data recorded Date Telepsych consult ordered in [facility]:  No data recorded Time Telepsych consult ordered in [facility]:  No data recorded  Patient Reported Information Reviewed? Yes  Patient Left Without Being Seen? No data recorded Reason for Not Completing Assessment: No data recorded  Collateral Involvement: wifeMakar Slatter [phone]   Does Patient Have a Court Appointed Legal Guardian? No data recorded Name and Contact of Legal Guardian: No data recorded If Minor and Not Living with Parent(s), Who has Custody? n/a  Is CPS involved or ever been involved? -- (None reported)  Is APS involved or ever been involved? -- (None reported)   Patient Determined To Be At Risk for Harm To Self or Others Based on Review of Patient Reported Information or Presenting Complaint? No (Pt reports to be unable to remember last night events and denies a hx of SI/HI or to endorse any current SI/HI)  Method: No data recorded Availability of Means: No data recorded Intent: No data recorded Notification Required: No data recorded Additional Information for Danger to Others Potential: No data recorded Additional Comments for Danger to Others Potential: No data recorded Are There Guns or Other Weapons in Your Home? No data recorded Types of Guns/Weapons: No data recorded Are These Weapons Safely Secured?                            No data recorded Who Could Verify You Are Able To Have These Secured: No data recorded Do You Have any Outstanding Charges, Pending Court Dates,  Parole/Probation? 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 Yes  IVC Papers Initial File Date: 07/09/2020   [place] of Residence: [place]   Patient Currently Receiving the Following Services: -- (NONE)   Determination of Need: Routine (7 days)   Options For Referral: Outpatient Therapy     CCA Biopsychosocial Intake/Chief Complaint:  No data recorded Current Symptoms/Problems: No data recorded  Patient Reported Schizophrenia/Schizoaffective Diagnosis in Past: No data recorded  Strengths: No data recorded Preferences: No data recorded Abilities: No data recorded  Type of Services Patient Feels are Needed: No data recorded  Initial Clinical Notes/Concerns: No data recorded  Mental Health Symptoms Depression:  No data recorded  Duration of Depressive symptoms: No data recorded  Mania:  No data recorded  Anxiety:   No data recorded  Psychosis:  No data recorded  Duration of Psychotic symptoms: No data recorded  Trauma:  No data recorded  Obsessions:  No data recorded  Compulsions:  No data recorded  Inattention:  No data recorded  Hyperactivity/Impulsivity:  No data recorded  Oppositional/Defiant Behaviors:  No data recorded  Emotional Irregularity:  No data recorded  Other Mood/Personality Symptoms:  No data recorded   Mental Status Exam Appearance and self-care  Stature:  No data recorded  Weight:  No data recorded  Clothing:  No data recorded  Grooming:  No data recorded  Cosmetic use:  No data recorded  Posture/gait:  No data recorded  Motor activity:  No data recorded  Sensorium  Attention:  No data recorded  Concentration:  No data recorded  Orientation:  No data recorded  Recall/memory:  No data recorded  Affect and Mood  Affect:  No data recorded  Mood:  No data recorded  Relating   Eye contact:  No data recorded  Facial expression:  No data recorded  Attitude toward examiner:  No data recorded  Thought and Language  Speech flow: No data recorded  Thought content:  No data recorded  Preoccupation:  No data recorded  Hallucinations:  No data recorded  Organization:  No data recorded  [employer] of Knowledge:  No data recorded  Intelligence:  No data recorded  Abstraction:  No data recorded  Judgement:  No data recorded  Reality Testing:  No data recorded  Insight:  No data recorded  Decision Making:  No data recorded  Social Functioning  Social Maturity:  No data recorded  Social Judgement:  No data recorded  Stress  Stressors:  No data recorded  Coping Ability:  No data recorded  Skill Deficits:  No data recorded  Supports:  No data recorded    Religion:    Leisure/Recreation:    Exercise/Diet:     CCA Employment/Education Employment/Work Situation:    Education:     CCA Family/Childhood History Family and Relationship History:    Childhood History:     Child/Adolescent Assessment:     CCA Substance Use Alcohol/Drug Use:  ASAM's:  Six Dimensions of Multidimensional Assessment  Dimension 1:  Acute Intoxication and/or Withdrawal Potential:      Dimension 2:  Biomedical Conditions and Complications:      Dimension 3:  Emotional, Behavioral, or Cognitive Conditions and Complications:     Dimension 4:  Readiness to Change:     Dimension 5:  Relapse, Continued use, or Continued Problem Potential:     Dimension 6:  Recovery/Living Environment:     ASAM Severity Score:    ASAM Recommended Level of Treatment:     Substance use Disorder (SUD)    Recommendations for Services/Supports/Treatments:    DSM5 Diagnoses: Patient Active Problem List   Diagnosis Date Noted  . 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  . Substance induced mood disorder (HCC) 07/09/2020  .  Generalized anxiety disorder 07/09/2020  . Alcohol intoxication (HCC) 07/09/2020  . Wound infection   . Cellulitis of left leg   . Cellulitis 10/19/2019  . Abnormal LFTs 10/19/2019  . GERD (gastroesophageal reflux disease) 10/19/2019  . Depression 10/19/2019    Patient Centered Plan: Patient is on the following Treatment Plan(s):  Anxiety   Referrals to Alternative Service(s): Referred to Alternative Service(s):   Place:   Date:   Time:    Referred to Alternative Service(s):   Place:   Date:   Time:    Referred to Alternative Service(s):   Place:   Date:   Time:    Referred to Alternative Service(s):   Place:   Date:  Time:     Opal Sidles, LCSWA

## 2020-07-09 NOTE — ED Provider Notes (Signed)
[facility] Emergency Department Provider Note  Time seen: 7:49 AM  I have reviewed the triage vital signs and the nursing notes.   HISTORY  Chief Complaint Psychiatric Evaluation   HPI Ralph Fitzgerald is a 34 y.o. male with no significant past medical history presents to the emergency department after a possible suicide attempt.  According to IVC the patient was brought in after being found next to his car coughing.  Per IVC there was a hose connecting the tailpipe into the car.  Per IVC and report patient reportedly told the police officer he was trying to "end it all.".  The patient denies attempting suicide.  States he was unaware of the hose.  Patient does state mild headache, is somnolent but able to answer questions appropriately and follow commands.  No other complaints.  Negative review of systems.   No past medical history on file.  Patient Active Problem List   Diagnosis Date Noted  . Wound infection   . Cellulitis of left leg   . Cellulitis 10/19/2019  . Abnormal LFTs 10/19/2019  . GERD (gastroesophageal reflux disease) 10/19/2019  . Depression 10/19/2019    Past Surgical History:  Procedure Laterality Date  . IRRIGATION AND DEBRIDEMENT FOOT Left 10/20/2019   Procedure: IRRIGATION AND DEBRIDEMENT FOOT;  Surgeon: Linus Galas, DPM;  Location: [facility] ORS;  Service: Podiatry;  Laterality: Left;    Prior to Admission medications   Medication Sig Start Date End Date Taking? Authorizing Provider  celecoxib (CELEBREX) 200 MG capsule Take 200 mg by mouth 2 (two) times daily. 10/11/19   [provider]  clonazePAM (KLONOPIN) 1 MG tablet Take 1 mg by mouth 2 (two) times daily as needed. 10/09/19   [provider]  Multiple Vitamin (MULTIVITAMIN) tablet Take 1 tablet by mouth daily.    [provider]  oxyCODONE-acetaminophen (PERCOCET) 10-325 MG tablet Take 1 tablet by mouth 3 (three) times daily as needed. 10/18/19   [provider]  pantoprazole (PROTONIX) 40 MG tablet Take 40 mg by mouth daily. 07/11/18   [provider]    Allergies  Allergen Reactions  . Penicillins Hives  . Diclofenac Rash    No family history on file.  Social History Social History   Tobacco Use  . Smoking status: Never Smoker  . Smokeless tobacco: Never Used  Vaping Use  . Vaping Use: Some days  . Substances: Flavoring  Substance Use Topics  . Alcohol use: Yes    Comment: occasional  . Drug use: Not Currently    Review of Systems Constitutional: Negative for fever Cardiovascular: Negative for chest pain. Respiratory: Negative for shortness of breath. Gastrointestinal: Negative for abdominal pain Musculoskeletal: Negative for musculoskeletal complaints Neurological: Negative for headache All other ROS negative  ____________________________________________   PHYSICAL EXAM:  VITAL SIGNS: ED Triage Vitals  Enc Vitals Group     BP 07/09/20 0510 (!) 145/93     Pulse Rate 07/09/20 0509 (!) 116     Resp 07/09/20 0509 20     Temp 07/09/20 0509 98.5 F (36.9 C)     Temp Source 07/09/20 0509 Oral     SpO2 07/09/20 0509 97 %     Weight 07/09/20 0508 170 lb (77.1 kg)     Height 07/09/20 0508 6' (1.829 m)     Head Circumference --      Peak Flow --      Pain Score 07/09/20 0508 0     Pain Loc --  Pain Edu? --      Excl. in GC? --    Constitutional: Somewhat somnolent but awakens easily to voice answers questions appropriately follows commands. Eyes: Normal exam ENT      Head: Normocephalic and atraumatic.      Mouth/Throat: Mucous membranes are moist. Cardiovascular: Normal rate, regular rhythm. No murmurs, rubs, or gallops. Respiratory: Normal respiratory effort without tachypnea nor retractions. Breath sounds are clear Gastrointestinal: Soft and nontender. No distention. Musculoskeletal: Nontender with normal range of motion in all extremities. Neurologic:  Normal speech and language. No  gross focal neurologic deficits Skin:  Skin is warm, dry and intact.  Psychiatric: Mood and affect are normal.  ____________________________________________   INITIAL IMPRESSION / ASSESSMENT AND PLAN / ED COURSE  Pertinent labs & imaging results that were available during my care of the patient were reviewed by me and considered in my medical decision making (see chart for details).   Patient presents to the emergency department for possible suicide attempt.  We will maintain the IVC until psychiatry can evaluate.  Patient basic labs are largely within normal limits besides an elevated alcohol level.  Patient does admit to 3 or 4 beers yesterday.  Patient denies SI or HI however given the IVC and report sounds like the patient may have made a legitimate suicide attempt.  We will also check a carboxyhemoglobin level, I have added on acetaminophen and salicylate levels as well.  Psychiatry is seen and evaluated the patient.  They have spoken to the patient's wife.  Appears to be combination of substance use alcohol and possibly stress at home.  The patient is now sober and denies any suicidal thoughts.  Psychiatry believes the patient safe for discharge home from psychiatric standpoint, wife agrees per psychiatry.  Patient's medical work-up is largely nonrevealing besides opiate, cannabinoid and benzodiazepine positive urine toxicology as well as elevated alcohol level.  Ralph Fitzgerald was evaluated in Emergency Department on 07/09/2020 for the symptoms described in the history of present illness. He was evaluated in the context of the global COVID-19 pandemic, which necessitated consideration that the patient might be at risk for infection with the SARS-CoV-2 virus that causes COVID-19. Institutional protocols and algorithms that pertain to the evaluation of patients at risk for COVID-19 are in a state of rapid change based on information released by regulatory bodies including the CDC and federal and  state organizations. These policies and algorithms were followed during the patient's care in the ED.  ____________________________________________   FINAL CLINICAL IMPRESSION(S) / ED DIAGNOSES  Suicide attempt   Minna Antis, MD 07/09/20 1229

## 2020-07-09 NOTE — ED Notes (Signed)
Pt given belongings at this time. Pt called family member to pick him up. Pt ambulatory to lobby with steady gait. Denies needs at this time.

## 2020-08-23 ENCOUNTER — Other Ambulatory Visit (HOSPITAL_COMMUNITY): Payer: Self-pay | Admitting: Internal Medicine

## 2020-08-23 ENCOUNTER — Other Ambulatory Visit: Payer: Self-pay | Admitting: Internal Medicine

## 2020-08-23 DIAGNOSIS — M25512 Pain in left shoulder: Secondary | ICD-10-CM

## 2020-08-23 DIAGNOSIS — G8929 Other chronic pain: Secondary | ICD-10-CM

## 2020-08-30 ENCOUNTER — Ambulatory Visit: Payer: Managed Care, Other (non HMO)

## 2020-08-30 ENCOUNTER — Ambulatory Visit: Admission: RE | Admit: 2020-08-30 | Payer: Managed Care, Other (non HMO) | Source: Ambulatory Visit

## 2020-09-10 ENCOUNTER — Ambulatory Visit
Admission: RE | Admit: 2020-09-10 | Discharge: 2020-09-10 | Disposition: A | Discharge status: Home / Self Care | Payer: Managed Care, Other (non HMO) | Source: Ambulatory Visit | Attending: Internal Medicine | Admitting: Internal Medicine

## 2020-09-10 ENCOUNTER — Other Ambulatory Visit: Payer: Self-pay

## 2020-09-10 DIAGNOSIS — M25512 Pain in left shoulder: Secondary | ICD-10-CM | POA: Insufficient documentation

## 2020-09-10 DIAGNOSIS — G8929 Other chronic pain: Secondary | ICD-10-CM | POA: Insufficient documentation

## 2020-09-10 IMAGING — MR MR SHOULDER*L* W/O CM
6 series · 34 of 40 positions shown · non-contrast
Comparison: None.

CLINICAL DATA: Left shoulder pain for 3-4 months.

EXAM:
MRI OF THE LEFT SHOULDER WITHOUT CONTRAST
TECHNIQUE: Multiplanar, multisequence MR imaging of the shoulder was performed.
No intravenous contrast was administered.

[Series 5: T2 fat-sat · axial · left · 4.0mm · 0.55mm/px · z∈[-31,+88]mm · 6 of 26 slices shown (1 of 4)]
[im 1/26]
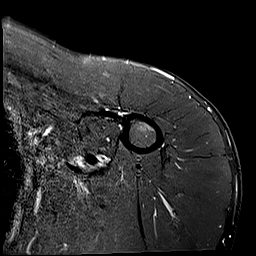
[im 6/26]
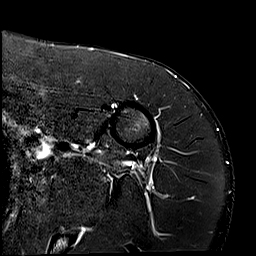
[im 11/26]
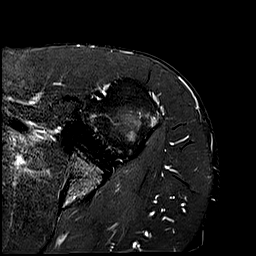
[im 16/26]
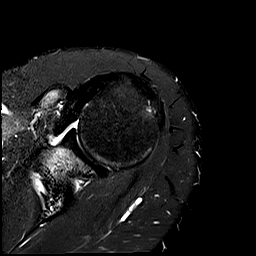
[im 21/26]
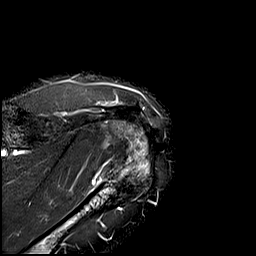
[im 26/26]
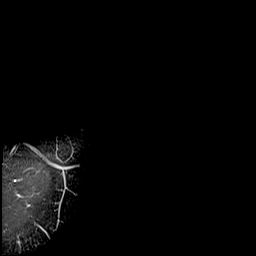

[Series 6: PD · oblique · left · 4.0mm · 0.44mm/px · 7 of 26 slices shown]
[im 1/26]
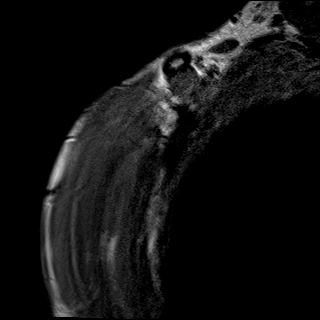
[im 5/26]
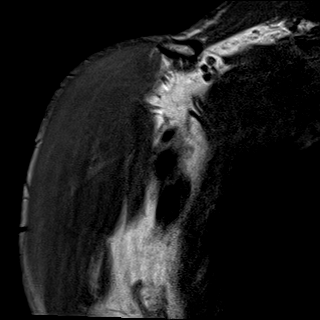
[im 9/26]
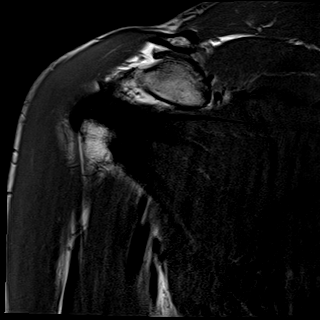
[im 13/26]
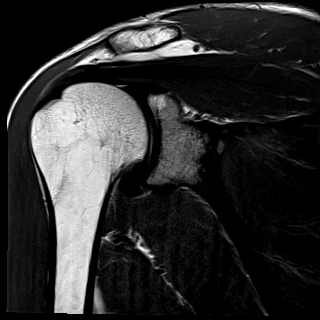
[im 17/26]
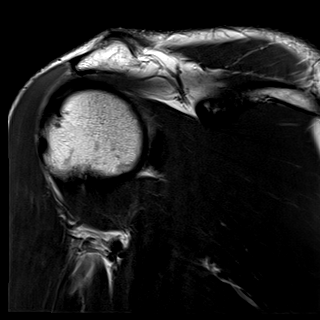
[im 21/26]
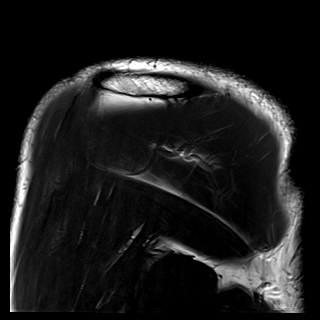
[im 26/26]
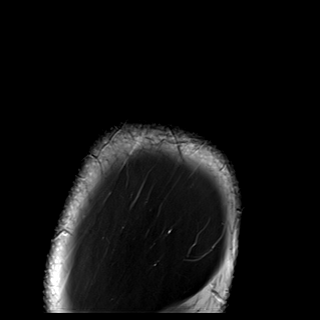

[Series 7: T2 fat-sat · oblique · left · 4.0mm · 0.44mm/px · 7 of 26 slices shown (2 of 4)]
[im 1/26]
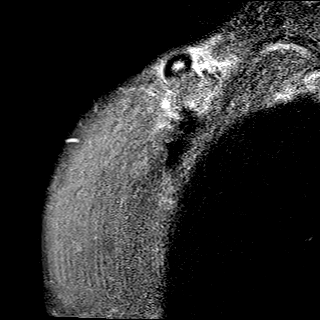
[im 5/26]
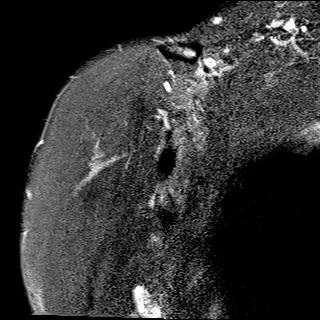
[im 9/26]
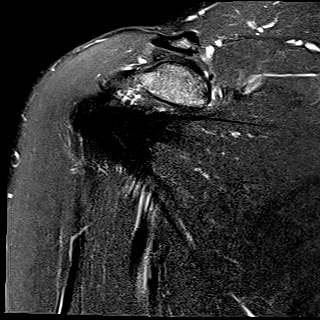
[im 13/26]
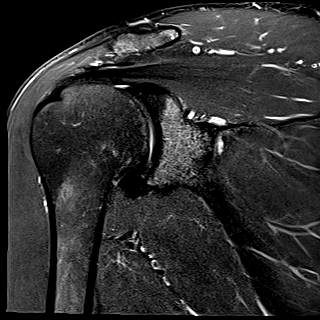
[im 17/26]
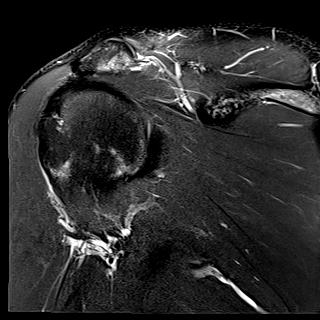
[im 21/26]
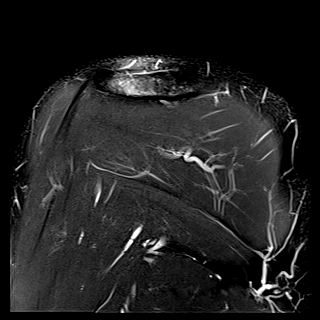
[im 26/26]
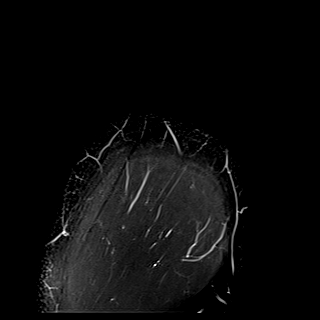

[Series 8: T2 fat-sat · oblique · left · 4.0mm · 0.23mm/px · 7 of 26 slices shown (3 of 4)]
[im 1/26]
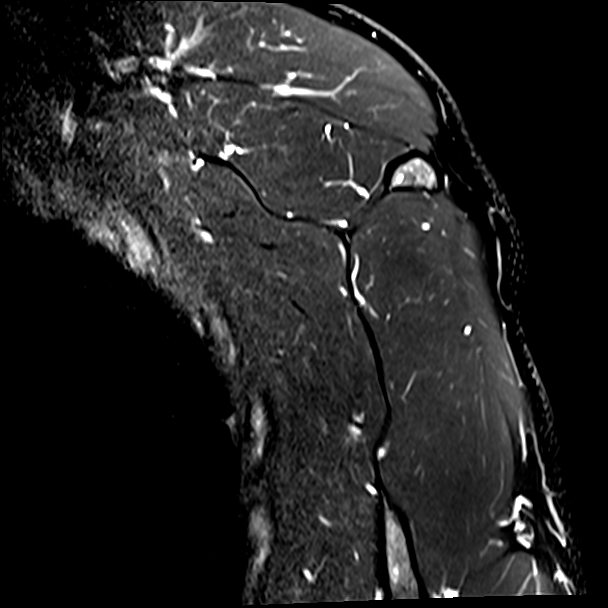
[im 5/26]
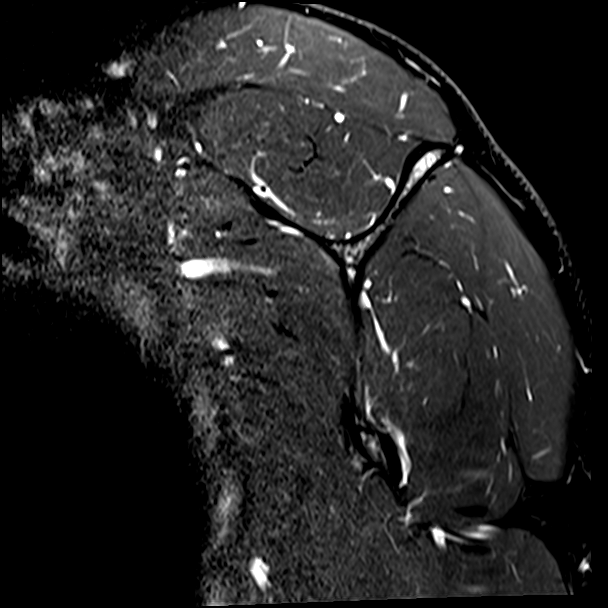
[im 9/26]
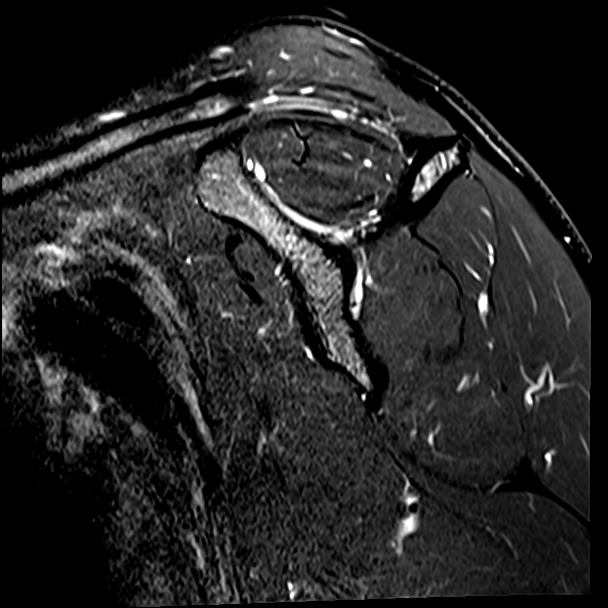
[im 13/26]
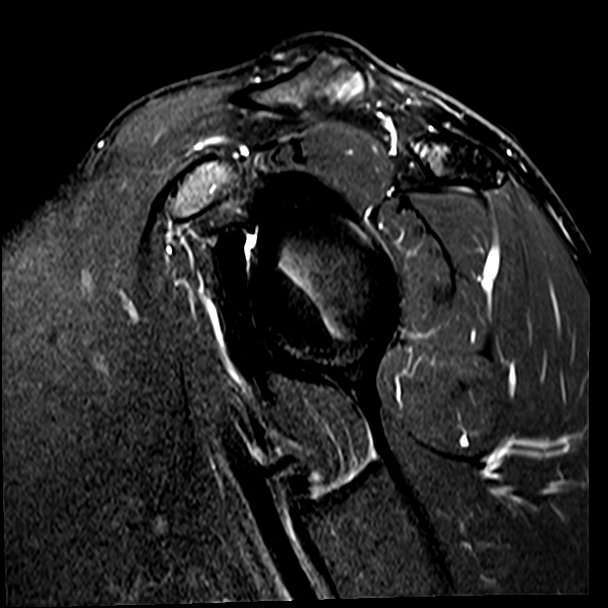
[im 17/26]
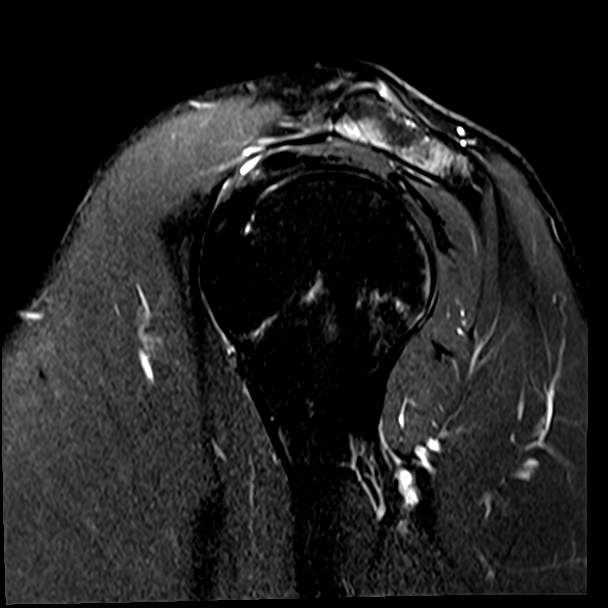
[im 21/26]
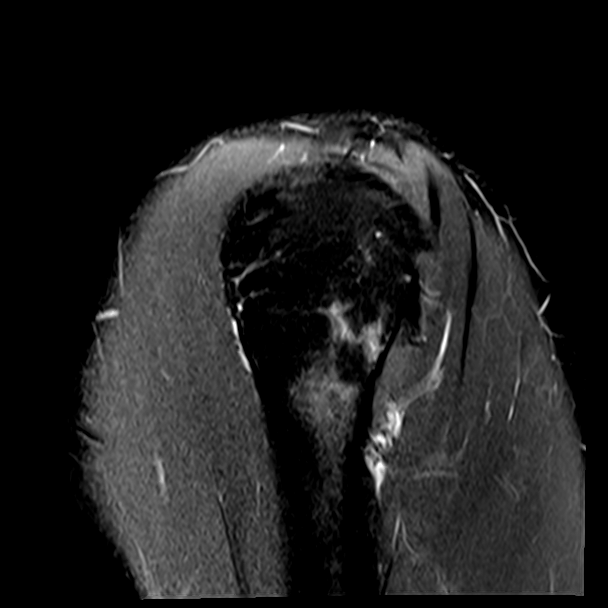
[im 26/26]
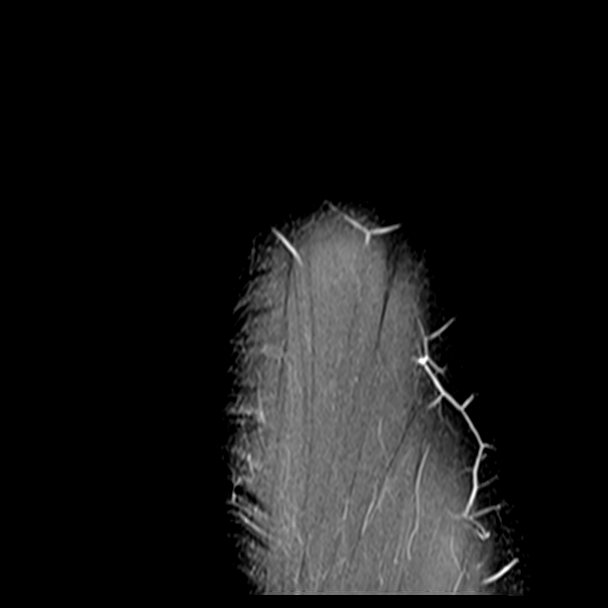

[Series 9: T1 · oblique · left · 4.0mm · 0.36mm/px · 1 of 26 slices shown]
[im 1/26]
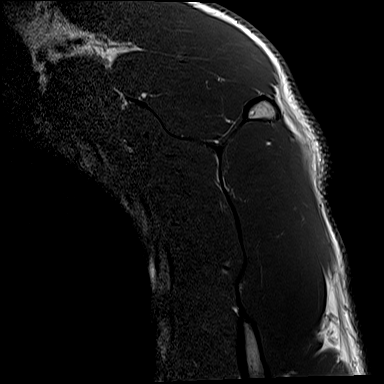

[Series 10: T2 fat-sat · axial · left · 4.0mm · 0.55mm/px · z∈[-50,+67]mm · 6 of 25 slices shown (4 of 4)]
[im 1/25]
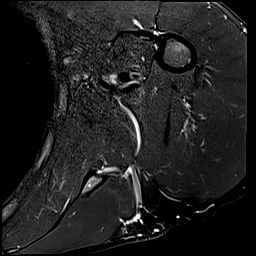
[im 5/25]
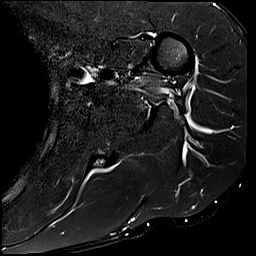
[im 10/25]
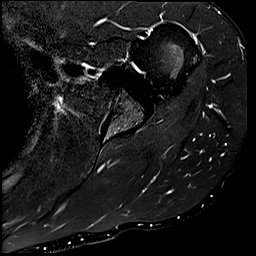
[im 15/25]
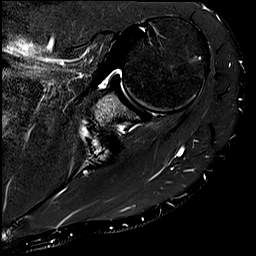
[im 20/25]
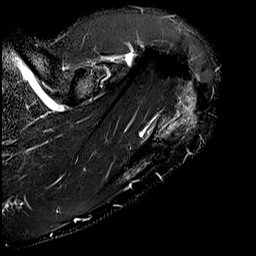
[im 25/25]
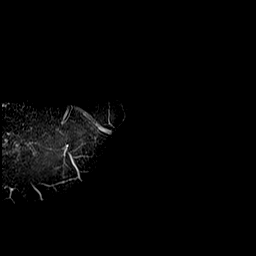

[34 of 40 positions shown; findings below may reference images not displayed]

FINDINGS: Rotator cuff: Mild tendinosis of the supraspinatus tendon.
Infraspinatus tendon is intact. Teres minor tendon is intact.
Subscapularis tendon is intact.

Muscles: No muscle atrophy or edema. No intramuscular fluid
collection or hematoma.

Biceps Long Head: Intraarticular and extraarticular portions of the
biceps tendon are intact.

Acromioclavicular Joint: Moderate arthropathy of the
acromioclavicular joint. Type I acromion. No subacromial/subdeltoid
bursal fluid.

Glenohumeral Joint: No joint effusion. No chondral defect.

Labrum: Grossly intact, but evaluation is limited by lack of
intraarticular fluid/contrast.

Bones: No fracture or dislocation. No aggressive osseous lesion.

Other: No fluid collection or hematoma.
IMPRESSION: Mild tendinosis of the supraspinatus tendon.

## 2021-02-20 ENCOUNTER — Ambulatory Visit: Admit: 2021-02-20 | Payer: Managed Care, Other (non HMO) | Admitting: Surgery

## 2021-02-20 SURGERY — SHOULDER ARTHROSCOPY WITH SUBACROMIAL DECOMPRESSION, ROTATOR CUFF REPAIR AND BICEP TENDON REPAIR
Anesthesia: Choice | Site: Shoulder | Laterality: Right

## 2021-09-01 ENCOUNTER — Emergency Department
Admission: EM | Admit: 2021-09-01 | Discharge: 2021-09-03 | Disposition: A | Discharge status: Nursing Home (Includes ICF) | Payer: Managed Care, Other (non HMO) | Attending: Emergency Medicine | Admitting: Emergency Medicine

## 2021-09-01 DIAGNOSIS — F4325 Adjustment disorder with mixed disturbance of emotions and conduct: Secondary | ICD-10-CM | POA: Diagnosis present

## 2021-09-01 DIAGNOSIS — R451 Restlessness and agitation: Secondary | ICD-10-CM

## 2021-09-01 DIAGNOSIS — R45851 Suicidal ideations: Secondary | ICD-10-CM | POA: Diagnosis not present

## 2021-09-01 DIAGNOSIS — F29 Unspecified psychosis not due to a substance or known physiological condition: Secondary | ICD-10-CM | POA: Diagnosis not present

## 2021-09-01 DIAGNOSIS — Z046 Encounter for general psychiatric examination, requested by authority: Secondary | ICD-10-CM | POA: Diagnosis present

## 2021-09-01 DIAGNOSIS — R4589 Other symptoms and signs involving emotional state: Secondary | ICD-10-CM | POA: Diagnosis present

## 2021-09-01 LAB — CBC WITH DIFFERENTIAL/PLATELET
Abs Immature Granulocytes: 0.06 10*3/uL (ref 0.00–0.07)
Basophils Absolute: 0 10*3/uL (ref 0.0–0.1)
Basophils Relative: 0 %
Eosinophils Absolute: 0.1 10*3/uL (ref 0.0–0.5)
Eosinophils Relative: 1 %
HCT: 39.2 % (ref 39.0–52.0)
Hemoglobin: 13.3 g/dL (ref 13.0–17.0)
Immature Granulocytes: 1 %
Lymphocytes Relative: 17 %
Lymphs Abs: 2.2 10*3/uL (ref 0.7–4.0)
MCH: 32 pg (ref 26.0–34.0)
MCHC: 33.9 g/dL (ref 30.0–36.0)
MCV: 94.2 fL (ref 80.0–100.0)
Monocytes Absolute: 1.3 10*3/uL — ABNORMAL HIGH (ref 0.1–1.0)
Monocytes Relative: 10 %
Neutro Abs: 9.3 10*3/uL — ABNORMAL HIGH (ref 1.7–7.7)
Neutrophils Relative %: 71 %
Platelets: 294 10*3/uL (ref 150–400)
RBC: 4.16 MIL/uL — ABNORMAL LOW (ref 4.22–5.81)
RDW: 13.1 % (ref 11.5–15.5)
WBC: 12.9 10*3/uL — ABNORMAL HIGH (ref 4.0–10.5)
nRBC: 0 % (ref 0.0–0.2)

## 2021-09-01 MED ORDER — MIDAZOLAM HCL 2 MG/2ML IJ SOLN
INTRAMUSCULAR | Status: AC
  Filled 2021-09-01: qty 4

## 2021-09-01 MED ORDER — HALOPERIDOL LACTATE 5 MG/ML IJ SOLN
INTRAMUSCULAR | Status: AC
  Filled 2021-09-01: qty 1

## 2021-09-01 NOTE — ED Triage Notes (Signed)
Patient arrived to ER by [facility] department from home. Patient has multiple superficial cuts to left arm with no bleeding noted. Patient refusing to change clothes into patient scrubs. Patient cursing at staff and officers at bedside.  Verbal order received for 4mg  Versed and 5 mg Haldol IM.  Patient took medications with no issues with [employer] officers and [employer] standing by for safety. Patient then dressed out by with writer and security standing by for safety. Blood work obtained.

## 2021-09-01 NOTE — ED Provider Notes (Signed)
[facility] Provider Note    Event Date/Time   First MD Initiated Contact with Patient 09/01/21 2304     (approximate)   History   Altered Mental Status   HPI  PHILIPPE GANG is a 36 y.o. male with past medical history of generalized anxiety, adjustment disorder, substance-induced mood disorder presents for IVC.  Patient is not willing to provide much history except for he denies having suicidal ideation.    Dependent history was obtained from police who had spoken to the patient's wife.  Apparently patient had taken money that they had saved and went to buy pills.  He then texted her saying that he had her gun and it was time to use it.  Also sent pictures of his arm with crutches on it.  Patient's wife palpitation for IVC.    History reviewed. No pertinent past medical history.  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   Substance induced mood disorder (HCC) 07/09/2020   Generalized anxiety disorder 07/09/2020   Alcohol intoxication (HCC) 07/09/2020   Wound infection    Cellulitis of left leg    Cellulitis 10/19/2019   Abnormal LFTs 10/19/2019   GERD (gastroesophageal reflux disease) 10/19/2019   Depression 10/19/2019     Physical Exam  Triage Vital Signs: ED Triage Vitals  Enc Vitals Group     BP --      Pulse --      Resp --      Temp --      Temp src --      SpO2 --      Weight 09/01/21 2353 135 lb (61.2 kg)     Height 09/01/21 2353 6\' 2"  (1.88 m)     Head Circumference --      Peak Flow --      Pain Score 09/01/21 2352 0     Pain Loc --      Pain Edu? --      Excl. in GC? --     Most recent vital signs: There were no vitals filed for this visit.   General: Awake, agitated CV:  Good peripheral perfusion.  Resp:  Normal effort Neuro:             Awake, Alert, Oriented x 3  Other:  Patient is awake and alert, he becomes very agitated  when asked to cooperate, minimally redirectable   ED Results / Procedures / Treatments  Labs (all labs ordered are listed, but only abnormal results are displayed) Labs Reviewed  CBC WITH DIFFERENTIAL/PLATELET - Abnormal; Notable for the following components:      Result Value   WBC 12.9 (*)    RBC 4.16 (*)    Neutro Abs 9.3 (*)    Monocytes Absolute 1.3 (*)    All other components within normal limits  COMPREHENSIVE METABOLIC PANEL  ACETAMINOPHEN LEVEL  SALICYLATE LEVEL  URINE DRUG SCREEN, QUALITATIVE ([facility] ONLY)     EKG     RADIOLOGY    PROCEDURES:  MEDICATIONS ORDERED IN ED: Medications  haloperidol lactate (HALDOL) 5 MG/ML injection ( Intramuscular Given 09/01/21 2315)  midazolam (VERSED) 2 MG/2ML injection ( Intramuscular Given 09/01/21 2316)     IMPRESSION / MDM / ASSESSMENT AND PLAN / ED COURSE  I reviewed the triage vital signs and the nursing notes.  Differential diagnosis includes, but is not limited to, substance-induced mood disorder, intoxication   Patient is 36 year old male with a history of mood disorder and substance use who presents under IVC due to concern for suicidal ideation.  Apparently he had told his wife that he had a gun and was ready to use it on himself.  Patient is quite agitated after being brought in by police does not want to be here and wants to go to work tomorrow.  Tried to verbally de-escalate and reason with the patient that if he cooperates with [place] we may be able to get him out tonight however patient refused to cooperate.  Given somewhat unclear situation and minimal collateral about what exactly went on I do not feel that he is appropriate to be discharged without obtaining more information.  To facilitate the patient cooperating removed to remove his close and stay for psychiatric evaluation he was given 5 mg of IM Haldol and 4 mg of IM Versed.  We will obtain Tylenol and salicylate levels.  He is  currently under IVC.  We will asked psychiatry to see.  The patient has been placed in psychiatric observation due to the need to provide a safe environment for the patient while obtaining psychiatric consultation and evaluation, as well as ongoing medical and medication management to treat the patient's condition.  The patient has been placed under full IVC at this time.       FINAL CLINICAL IMPRESSION(S) / ED DIAGNOSES   Final diagnoses:  Agitation     Rx / DC Orders   ED Discharge Orders     None        Note:  This document was prepared using Dragon voice recognition software and may include unintentional dictation errors.   Georga Hacking, MD 09/02/21 0001

## 2021-09-02 DIAGNOSIS — F4325 Adjustment disorder with mixed disturbance of emotions and conduct: Secondary | ICD-10-CM

## 2021-09-02 DIAGNOSIS — R4589 Other symptoms and signs involving emotional state: Secondary | ICD-10-CM | POA: Diagnosis present

## 2021-09-02 LAB — COMPREHENSIVE METABOLIC PANEL
ALT: 24 U/L (ref 0–44)
AST: 24 U/L (ref 15–41)
Albumin: 4 g/dL (ref 3.5–5.0)
Alkaline Phosphatase: 52 U/L (ref 38–126)
Anion gap: 9 (ref 5–15)
BUN: 18 mg/dL (ref 6–20)
CO2: 27 mmol/L (ref 22–32)
Calcium: 9.4 mg/dL (ref 8.9–10.3)
Chloride: 105 mmol/L (ref 98–111)
Creatinine, Ser: 1.05 mg/dL (ref 0.61–1.24)
GFR, Estimated: >60 mL/min (ref >60)
Glucose, Bld: 93 mg/dL (ref 70–99)
Potassium: 3.6 mmol/L (ref 3.5–5.1)
Sodium: 141 mmol/L (ref 135–145)
Total Bilirubin: 0.6 mg/dL (ref 0.3–1.2)
Total Protein: 7.3 g/dL (ref 6.5–8.1)

## 2021-09-02 LAB — URINE DRUG SCREEN, QUALITATIVE (ARMC ONLY)
Amphetamines, Ur Screen: NOT DETECTED
Barbiturates, Ur Screen: NOT DETECTED
Benzodiazepine, Ur Scrn: POSITIVE — AB
Cannabinoid 50 Ng, Ur ~~LOC~~: POSITIVE — AB
Cocaine Metabolite,Ur ~~LOC~~: POSITIVE — AB
MDMA (Ecstasy)Ur Screen: NOT DETECTED
Methadone Scn, Ur: NOT DETECTED
Opiate, Ur Screen: NOT DETECTED
Phencyclidine (PCP) Ur S: NOT DETECTED
Tricyclic, Ur Screen: NOT DETECTED

## 2021-09-02 LAB — ACETAMINOPHEN LEVEL: Acetaminophen (Tylenol), Serum: <10 ug/mL — ABNORMAL LOW (ref 10–30)

## 2021-09-02 LAB — SALICYLATE LEVEL: Salicylate Lvl: <7 mg/dL — ABNORMAL LOW (ref 7.0–30.0)

## 2021-09-02 MED ORDER — MIDAZOLAM HCL 2 MG/2ML IJ SOLN: 4.0000 mg | Freq: Once | INTRAMUSCULAR | Status: AC

## 2021-09-02 MED ORDER — HALOPERIDOL LACTATE 5 MG/ML IJ SOLN: 5.0000 mg | Freq: Once | INTRAMUSCULAR | Status: AC

## 2021-09-02 MED ORDER — PAROXETINE HCL 20 MG PO TABS
20.0000 mg | ORAL_TABLET | Freq: Every day | ORAL | Status: DC
Start: 2021-09-02 — End: 2021-09-03
  Administered 2021-09-02: 20 mg via ORAL
  Filled 2021-09-02 (×3): qty 1

## 2021-09-02 MED ORDER — CLONAZEPAM 0.5 MG PO TABS: 1.0000 mg | ORAL_TABLET | Freq: Two times a day (BID) | ORAL | Status: DC | PRN

## 2021-09-02 NOTE — ED Notes (Signed)
Gave food tray with water. °

## 2021-09-02 NOTE — ED Notes (Signed)
IVC/pending psych consult 

## 2021-09-02 NOTE — ED Notes (Signed)
Pt's wife here to visit with pt.  Georganna Skeans PR with visitor to room.

## 2021-09-02 NOTE — ED Notes (Signed)
Report received from Amy, RN. Patient currently sleeping, respirations regular and unlabored. Q15 minute rounds and observation by Rover and Officer to continue. Will assess patient once awake. 

## 2021-09-02 NOTE — ED Notes (Signed)
Pt's mother came to visit, but pt is still asleep.

## 2021-09-02 NOTE — ED Notes (Signed)
Resumed care from ally rn.  Pt sleeping.   °

## 2021-09-02 NOTE — Consult Note (Addendum)
[facility] Face-to-Face Psychiatry Consult   Reason for Consult:  threatening to kill himself Referring Physician:  EDP Patient Identification: Ralph Fitzgerald MRN:  [id number] Principal Diagnosis: Suicidal behavior Diagnosis:  Principal Problem:   Suicidal behavior Active Problems:   Adjustment disorder with mixed disturbance of emotions and conduct   Total Time spent with patient: 1 hour  Subjective:   Ralph Fitzgerald is a 36 y.o. male patient admitted with threatening to kill himself. Patient's UDS is positive for benzodiazepines (has prescribed clonazepam), cocaine, cannabinoids.     HPI:  On approach, patient is calm, quiet.  Gives very limited responses.  He states that he does not have any drug abuse problems.  Also states that he has never had any psychiatric problems.  His primary care provider does prescribe antidepressants, weaned from Cymbalta onto Celexa 20 mg daily recently.  He takes clonazepam for severe anxiety.  Patient is reluctant to give much information at all.  States that he has never been hospitalized psychiatrically.  Denies suicidal or homicidal ideations.  Denies auditory or visual hallucinations.  Denies paranoia.  Patient denies ever trying to kill himself.  Collateral from wife, Jhared Bizon, [phone]: Patient took a picture of himself where he had cut himself on the arm and sent to her.  Stated that he was going to kill himself.  Wife states that patient has been very manipulative.  He took $500 recently and bought drugs.  She states "he needs help."  She reports that last year he tried to kill himself via carbon monoxide poisoning and she had to get him out of the car and revive him.  She is afraid for herself and her children.  She reports that patient has  anger problem along with substance abuse issues.  He has been in rehab before, she states.  She is afraid for herself and her there were children, along with being afraid that he is going to kill  himself.  Recommend psychiatric hospitlization     Past Psychiatric History: per wife, he had suicide attempt by carbon monoxide poisoning  Risk to Self:   Risk to Others:   Prior Inpatient Therapy:   Prior Outpatient Therapy:    Past Medical History: History reviewed. No pertinent past medical history.  Past Surgical History:  Procedure Laterality Date   IRRIGATION AND DEBRIDEMENT FOOT Left 10/20/2019   Procedure: IRRIGATION AND DEBRIDEMENT FOOT;  Surgeon: Linus Galas, DPM;  Location: [facility] ORS;  Service: Podiatry;  Laterality: Left;   Family History: History reviewed. No pertinent family history. Family Psychiatric  History: father bipolar (per wife) Social History:  Social History   Substance and Sexual Activity  Alcohol Use Yes   Comment: occasional     Social History   Substance and Sexual Activity  Drug Use Yes   Types: Cocaine, Marijuana    Social History   Socioeconomic History   Marital status: Married    Spouse name: Not on file   Number of children: Not on file   Years of education: Not on file   Highest education level: Not on file  Occupational History   Not on file  Tobacco Use   Smoking status: Never   Smokeless tobacco: Never  Vaping Use   Vaping Use: Some days   Substances: Flavoring  Substance and Sexual Activity   Alcohol use: Yes    Comment: occasional   Drug use: Yes    Types: Cocaine, Marijuana   Sexual activity: Not on file  Other  Topics Concern   Not on file  Social History Narrative   Not on file   Social Determinants of Health   Financial Resource Strain: Not on file  Food Insecurity: Not on file  Transportation Needs: Not on file  Physical Activity: Not on file  Stress: Not on file  Social Connections: Not on file   Additional Social History:    Allergies:   Allergies  Allergen Reactions   Meloxicam Hives, Itching and Rash   Penicillins Hives   Diclofenac Rash    Labs:  Results for orders placed or performed  during the hospital encounter of 09/01/21 (from the past 48 hour(s))  CBC with Differential/Platelet     Status: Abnormal   Collection Time: 09/01/21 11:44 PM  Result Value Ref Range   WBC 12.9 (H) 4.0 - 10.5 K/uL   RBC 4.16 (L) 4.22 - 5.81 MIL/uL   Hemoglobin 13.3 13.0 - 17.0 g/dL   HCT 48.2 70.7 - 86.7 %   MCV 94.2 80.0 - 100.0 fL   MCH 32.0 26.0 - 34.0 pg   MCHC 33.9 30.0 - 36.0 g/dL   RDW 54.4 92.0 - 10.0 %   Platelets 294 150 - 400 K/uL   nRBC 0.0 0.0 - 0.2 %   Neutrophils Relative % 71 %   Neutro Abs 9.3 (H) 1.7 - 7.7 K/uL   Lymphocytes Relative 17 %   Lymphs Abs 2.2 0.7 - 4.0 K/uL   Monocytes Relative 10 %   Monocytes Absolute 1.3 (H) 0.1 - 1.0 K/uL   Eosinophils Relative 1 %   Eosinophils Absolute 0.1 0.0 - 0.5 K/uL   Basophils Relative 0 %   Basophils Absolute 0.0 0.0 - 0.1 K/uL   Immature Granulocytes 1 %   Abs Immature Granulocytes 0.06 0.00 - 0.07 K/uL    Comment: Performed at [facility], [address] [place], [place] [address]  Comprehensive metabolic panel     Status: None   Collection Time: 09/01/21 11:44 PM  Result Value Ref Range   Sodium 141 135 - 145 mmol/L   Potassium 3.6 3.5 - 5.1 mmol/L   Chloride 105 98 - 111 mmol/L   CO2 27 22 - 32 mmol/L   Glucose, Bld 93 70 - 99 mg/dL    Comment: Glucose reference range applies only to samples taken after fasting for at least 8 hours.   BUN 18 6 - 20 mg/dL   Creatinine, Ser 7.58 0.61 - 1.24 mg/dL   Calcium 9.4 8.9 - 83.2 mg/dL   Total Protein 7.3 6.5 - 8.1 g/dL   Albumin 4.0 3.5 - 5.0 g/dL   AST 24 15 - 41 U/L   ALT 24 0 - 44 U/L   Alkaline Phosphatase 52 38 - 126 U/L   Total Bilirubin 0.6 0.3 - 1.2 mg/dL   GFR, Estimated >54 >98 mL/min    Comment: (NOTE) Calculated using the CKD-EPI Creatinine Equation (2021)    Anion gap 9 5 - 15    Comment: Performed at [facility], [address] [place], [place] [address]  Acetaminophen level     Status: Abnormal   Collection Time: 09/01/21  11:44 PM  Result Value Ref Range   Acetaminophen (Tylenol), Serum <10 (L) 10 - 30 ug/mL    Comment: (NOTE) Therapeutic concentrations vary significantly. A range of 10-30 ug/mL  may be an effective concentration for many patients. However, some  are best treated at concentrations outside of this range. Acetaminophen concentrations >150 ug/mL  at 4 hours after ingestion  and >50 ug/mL at 12 hours after ingestion are often associated with  toxic reactions.  Performed at [facility], [address] [place], [place] [address]   Salicylate level     Status: Abnormal   Collection Time: 09/01/21 11:44 PM  Result Value Ref Range   Salicylate Lvl <7.0 (L) 7.0 - 30.0 mg/dL    Comment: Performed at [facility], [address] [place], [place] [address]  Urine Drug Screen, Qualitative ([facility] only)     Status: Abnormal   Collection Time: 09/02/21 10:39 AM  Result Value Ref Range   Tricyclic, Ur Screen NONE DETECTED NONE DETECTED   Amphetamines, Ur Screen NONE DETECTED NONE DETECTED   MDMA (Ecstasy)Ur Screen NONE DETECTED NONE DETECTED   Cocaine Metabolite,Ur [place] POSITIVE (A) NONE DETECTED   Opiate, Ur Screen NONE DETECTED NONE DETECTED   Phencyclidine (PCP) Ur S NONE DETECTED NONE DETECTED   Cannabinoid 50 Ng, Ur [place] POSITIVE (A) NONE DETECTED   Barbiturates, Ur Screen NONE DETECTED NONE DETECTED   Benzodiazepine, Ur Scrn POSITIVE (A) NONE DETECTED   Methadone Scn, Ur NONE DETECTED NONE DETECTED    Comment: (NOTE) Tricyclics + metabolites, urine    Cutoff 1000 ng/mL Amphetamines + metabolites, urine  Cutoff 1000 ng/mL MDMA (Ecstasy), urine              Cutoff 500 ng/mL Cocaine Metabolite, urine          Cutoff 300 ng/mL Opiate + metabolites, urine        Cutoff 300 ng/mL Phencyclidine (PCP), urine         Cutoff 25 ng/mL Cannabinoid, urine                 Cutoff 50 ng/mL Barbiturates + metabolites, urine  Cutoff 200 ng/mL Benzodiazepine, urine              Cutoff 200  ng/mL Methadone, urine                   Cutoff 300 ng/mL  The urine drug screen provides only a preliminary, unconfirmed analytical test result and should not be used for non-medical purposes. Clinical consideration and professional judgment should be applied to any positive drug screen result due to possible interfering substances. A more specific alternate chemical method must be used in order to obtain a confirmed analytical result. Gas chromatography / mass spectrometry (GC/MS) is the preferred confirm atory method. Performed at [facility], [address] [place], [place] [address]     Current Facility-Administered Medications  Medication Dose Route Frequency Provider Last Rate Last Admin   haloperidol lactate (HALDOL) injection 5 mg  5 mg Intramuscular Once Georga Hacking, MD       midazolam (VERSED) injection 4 mg  4 mg Intramuscular Once Georga Hacking, MD       Current Outpatient Medications  Medication Sig Dispense Refill   cetirizine (ZYRTEC) 10 MG tablet Take 10 mg by mouth daily.     clonazePAM (KLONOPIN) 1 MG tablet Take 1 mg by mouth 2 (two) times daily as needed for anxiety.     Multiple Vitamin (MULTIVITAMIN) tablet Take 1 tablet by mouth daily.     pantoprazole (PROTONIX) 40 MG tablet Take 40 mg by mouth daily.     PARoxetine (PAXIL) 20 MG tablet Take 20 mg by mouth daily.      Musculoskeletal: Strength & Muscle Tone: within normal limits Gait & Station:  normal Patient leans: N/A   Psychiatric Specialty Exam:  Presentation  General Appearance: Disheveled  Eye Contact:Fair  Speech:Clear and Coherent  Speech Volume:Normal  Handedness:No data recorded  Mood and Affect  Mood:Depressed; Anxious  Affect:Congruent   Thought Process  Thought Processes:Coherent  Descriptions of Associations:Intact  Orientation:Full (Time, Place and Person)  Thought Content:WDL  History of Schizophrenia/Schizoaffective disorder:No data  recorded Duration of Psychotic Symptoms:No data recorded Hallucinations:Hallucinations: None  Ideas of Reference:None  Suicidal Thoughts:Suicidal Thoughts: No (Denies at this time)  Homicidal Thoughts:Homicidal Thoughts: No   Sensorium  Memory:Immediate Fair  Judgment:Poor  Insight:Poor   Executive Functions  Concentration:No data recorded Attention Span:Fair  Recall:Fair  Fund of Knowledge:Fair  Language:Fair   Psychomotor Activity  Psychomotor Activity:Psychomotor Activity: Normal   Assets  Assets:Financial Resources/Insurance; Housing; Resilience   Sleep  Sleep:Sleep: Fair   Physical Exam: Physical Exam Vitals and nursing note reviewed.  HENT:     Head: Normocephalic.     Nose: No congestion or rhinorrhea.  Eyes:     General:        Right eye: No discharge.        Left eye: No discharge.  Cardiovascular:     Rate and Rhythm: Normal rate.  Musculoskeletal:        General: Normal range of motion.     Cervical back: Normal range of motion.  Skin:    General: Skin is dry.  Neurological:     Mental Status: He is alert and oriented to person, place, and time.  Psychiatric:        Attention and Perception: Attention normal.        Mood and Affect: Affect is blunt.        Speech: Speech normal.        Behavior: Behavior is withdrawn.        Thought Content: Thought content does not include homicidal or suicidal ideation.        Cognition and Memory: Cognition normal.        Judgment: Judgment normal.   Review of Systems  Psychiatric/Behavioral:  Positive for depression, substance abuse and suicidal ideas.   All other systems reviewed and are negative. Blood pressure 123/83, pulse 67, temperature 98.3 F (36.8 C), temperature source Oral, resp. rate 14, height 6\' 2"  (1.88 m), weight 61.2 kg, SpO2 99 %. Body mass index is 17.33 kg/m.  Treatment Plan Summary: Daily contact with patient to assess and evaluate symptoms and progress in treatment.  Refer to inpatient psychiatry.   Disposition: Recommend psychiatric Inpatient admission when medically cleared.  Vanetta MuldersLouise F Deshone Lyssy, NP 09/02/2021 1:02 PM

## 2021-09-02 NOTE — BH Assessment (Signed)
TTS/Psych NP were unable to assess pt at this time due to pt receiving medications for agitation. Follow up by the psych team needed.

## 2021-09-02 NOTE — ED Notes (Signed)
Hospital meal provided.  100% consumed, pt tolerated w/o complaints.  Waste discarded appropriately.   

## 2021-09-02 NOTE — BH Assessment (Signed)
Patient has been accepted to [facility] on tomm morning 09/03/21 after 8:00am.  Patient assigned to [facility] Accepting physician is Dr. Betti Cruz.  Call report to [phone].  Representative was [employer].   ER Staff is aware of it:  Nitchia, ER Secretary  Dr.Malinda, ER MD  Amy, Patient's Nurse     Patient's Family/Support System (Wife, Amy Hefner [phone]) has been updated as well.

## 2021-09-02 NOTE — BH Assessment (Signed)
Adult MH  Referral information for Psychiatric Hospitalization faxed to:   [facility] ([phone]-or- [phone]),   [facility] ([phone]),   [facility] ([phone] -or- [phone]),   [place] ([phone]---[phone]),   [place] ([phone] or [phone])   [place] ([phone] or [phone]),   [place] ([phone]) 

## 2021-09-02 NOTE — ED Notes (Signed)
Patient has a visitor at this time. 

## 2021-09-02 NOTE — ED Notes (Signed)
Using phone to speak with wife.

## 2021-09-02 NOTE — ED Notes (Signed)
Pt provided with night snack °

## 2021-09-02 NOTE — ED Notes (Signed)
[facility], accepted to [facility] on 2.22.23

## 2021-09-02 NOTE — BH Assessment (Signed)
Comprehensive Clinical Assessment (CCA) Note  09/02/2021 Ralph Fitzgerald [id number]  Ralph Fitzgerald, 36 year old male, ale who presents to [facility] ED involuntarily for treatment. Per triage note, Patient arrived to ER by [facility] department from home. Patient has multiple superficial cuts to left arm with no bleeding noted. Patient refusing to change clothes into patient scrubs. Patient cursing at staff and officers at bedside.  Verbal order received for 4mg  Versed and 5 mg Haldol IM.  Patient took medications with no issues with [employer] officers and [employer] standing by for safety. Patient then dressed out by with writer and security standing by for safety. Blood work obtained.   During TTS assessment pt presents alert and oriented x 4, restless but cooperative, and mood-congruent with affect. The pt does not appear to be responding to internal or external stimuli. Neither is the pt presenting with any delusional thinking. Pt verified the information provided to triage RN.   Pt identifies his main complaint to be that he and his wife were arguing over the phone. Patient mentioned several stressors affecting their marriage. Patient reports he recently started a new job, and they are in the process of moving. Patient states he was driving, and the police followed him and brought him to the ED. Patient minimized the incident and states everything is ok. Patient denied past attempts of self-harm. Patient reports he is compliant with his medications and takes them as prescribed. Patient reports he does not have an issue with illicit substances and uses marijuana and cocaine every blue moon. Patient reports he does not see a therapist for outpatient treatment but has a primary care provider that prescribes medication for anxiety. Patient reports he has been sleeping well; however, for the past couple of weeks, his appetite is not good due to a change in his meds. Patient reports he is weaning off  Cymbalta and now currently taking Paxil and Klonopin. Pt denies current SI/HI/AH/VH. Patient reports no family hx of mental health. Pt contracts for safety.    Pt provided his wife as a collateral contact. Psych Team spoke with patients wife, and she reports patient has a serious problem with depression, anxiety, anger, and substance use. Wife states she is concerned about the safety of their children, for herself and the patient. Wife reports patient has threatened to kill himself multiple times and has made several attempts. Wife believes patients medication is not working or needs some adjusting. Wife asked that we alert her if/when patient is to be admitted and when he will be discharged. Wife states patient took $62 yesterday and purchased drugs.   Per Barbaraann Share, NP, pt is recommended for inpatient psychiatric admission.    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 No data recorded Practice/Facility Name: No data recorded Practice/Facility Phone Number: No data recorded Name of Contact: No data recorded Contact Number: No data recorded Contact Fax Number: No data recorded Prescriber Name: No data recorded Prescriber Address (if known): No data recorded  What Is the Reason for Your Visit/Call Today? Patient reports he was brought to the ED after he and his wife got into an argument.  How Long Has This  Been Causing You Problems? <Week  What Do You Feel Would Help You the Most Today? -- (Assessment only)   Have You Recently Been in Any Inpatient Treatment (Hospital/Detox/Crisis Center/28-Day Program)? No data recorded Name/Location of Program/Hospital:No data  recorded How Long Were You There? No data recorded When Were You Discharged? No data recorded  Have You Ever Received Services From [facility] Before? No data recorded Who Do You See at [facility]? No data recorded  Have You Recently Had Any Thoughts About Hurting Yourself? No  Are You Planning to Commit Suicide/Harm Yourself At This time? No   Have you Recently Had Thoughts About [place]? No  Explanation: No data recorded  Have You Used Any Alcohol or Drugs in the Past 24 Hours? No  How Long Ago Did You Use Drugs or Alcohol? No data recorded What Did You Use and How Much? No data recorded  Do You Currently Have a Therapist/Psychiatrist? No  Name of Therapist/Psychiatrist: No data recorded  Have You Been Recently Discharged From Any Office Practice or Programs? No  Explanation of Discharge From Practice/Program: No data recorded    CCA Screening Triage Referral Assessment Type of Contact: Face-to-Face  Is this Initial or Reassessment? No data recorded Date Telepsych consult ordered in [facility]:  No data recorded Time Telepsych consult ordered in [facility]:  No data recorded  Patient Reported Information Reviewed? No data recorded Patient Left Without Being Seen? No data recorded Reason for Not Completing Assessment: No data recorded  Collateral Involvement: wifeTayquan Fitzgerald [id number]   Does Patient Have a [place]? No data recorded Name and Contact of Legal Guardian: No data recorded If Minor and Not Living with Parent(s), Who has Custody? n/a  Is CPS involved or ever been involved? No data recorded Is APS involved or ever been involved? No data recorded  Patient Determined To Be At Risk for Harm To Self or Others Based on Review of Patient Reported Information or Presenting Complaint? Yes, for Self-Harm  Method: No data recorded Availability of Means: No data recorded Intent: No data recorded Notification Required: No data  recorded Additional Information for Danger to Others Potential: No data recorded Additional Comments for Danger to Others Potential: No data recorded Are There Guns or Other Weapons in Your Home? No data recorded Types of Guns/Weapons: No data recorded Are These Weapons Safely Secured?                            No data recorded Who Could Verify You Are Able To Have These Secured: No data recorded Do You Have any Outstanding Charges, Pending Court Dates, Parole/Probation? No data recorded Contacted To Inform of Risk of Harm To Self or Others: No data recorded  Location of Assessment: [facility] ED   Does Patient Present under Involuntary Commitment? Yes  IVC Papers Initial File Date: 09/01/21   [place] of Residence: [place]   Patient Currently Receiving the Following Services: Medication Management   Determination of Need: Urgent (48 hours)   Options For Referral: ED Visit; Inpatient Hospitalization; Medication Management; Intensive Outpatient Therapy     CCA Biopsychosocial Intake/Chief Complaint:  No data recorded Current Symptoms/Problems: No data recorded  Patient Reported Schizophrenia/Schizoaffective Diagnosis in Past: No   Strengths: Patient is able to verbalize and communicate needs.  Preferences: No data recorded Abilities: No data recorded  Type of Services Patient Feels are Needed: No data recorded  Initial Clinical Notes/Concerns: No  data recorded  Mental Health Symptoms Depression:   Change in energy/activity   Duration of Depressive symptoms:  Greater than two weeks   Mania:   None   Anxiety:    Difficulty concentrating   Psychosis:   None   Duration of Psychotic symptoms: No data recorded  Trauma:   None   Obsessions:   None   Compulsions:   None   Inattention:   None   Hyperactivity/Impulsivity:   None   Oppositional/Defiant Behaviors:   None   Emotional Irregularity:   Potentially harmful impulsivity; Intense/inappropriate  anger   Other Mood/Personality Symptoms:  No data recorded   Mental Status Exam Appearance and self-care  Stature:   Average   Weight:   Average weight   Clothing:   Casual; Age-appropriate   Grooming:   Normal   Cosmetic use:   None   Posture/gait:   Normal   Motor activity:   Not Remarkable   Sensorium  Attention:   Normal   Concentration:   Normal   Orientation:   X5   Recall/memory:   Normal   Affect and Mood  Affect:   Anxious   Mood:   Anxious   Relating  Eye contact:   Normal   Facial expression:   Anxious   Attitude toward examiner:   Cooperative   Thought and Language  Speech flow:  Normal   Thought content:   Appropriate to Mood and Circumstances   Preoccupation:   None   Hallucinations:   None   Organization:  No data recorded  [employer] of Knowledge:   Average   Intelligence:   Average   Abstraction:   Normal   Judgement:   Normal   Reality Testing:   Adequate   Insight:   Denial   Decision Making:   Impulsive   Social Functioning  Social Maturity:   Irresponsible; Impulsive   Social Judgement:  No data recorded  Stress  Stressors:   Family conflict; Transitions   Coping Ability:   Programme researcher, broadcasting/film/video Deficits:   Decision making   Supports:   Family     Religion:    Leisure/Recreation:    Exercise/Diet: Exercise/Diet Do You Follow a Special Diet?: No Do You Have Any Trouble Sleeping?: No   CCA Employment/Education Employment/Work Situation: Employment / Work Situation Employment Situation: Employed  Education:     CCA Family/Childhood History Family and Relationship History: Family history Marital status: Married Does patient have children?: Yes  Childhood History:     Child/Adolescent Assessment:     CCA Substance Use Alcohol/Drug Use: Alcohol / Drug Use Pain Medications: See PTA Prescriptions: See PTA Over the Counter: See PTA History  of alcohol / drug use?: Yes Longest period of sobriety (when/how long): Unknown Negative Consequences of Use: Financial, Personal relationships, Work / School Substance #1 Name of Substance 1: Marijuana                       ASAM's:  Six Dimensions of Multidimensional Assessment  Dimension 1:  Acute Intoxication and/or Withdrawal Potential:      Dimension 2:  Biomedical Conditions and Complications:      Dimension 3:  Emotional, Behavioral, or Cognitive Conditions and Complications:     Dimension 4:  Readiness to Change:     Dimension 5:  Relapse, Continued use, or Continued Problem Potential:     Dimension 6:  Recovery/Living Environment:     ASAM Severity  Score:    ASAM Recommended Level of Treatment:     Substance use Disorder (SUD)    Recommendations for Services/Supports/Treatments: Recommendations for Services/Supports/Treatments Recommendations For Services/Supports/Treatments: Individual Therapy, Inpatient Hospitalization  DSM5 Diagnoses: 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   Substance induced mood disorder ([place]) 07/09/2020   Generalized anxiety disorder 07/09/2020   Alcohol intoxication ([place]) 07/09/2020   Wound infection    Cellulitis of left leg    Cellulitis 10/19/2019   Abnormal LFTs 10/19/2019   GERD (gastroesophageal reflux disease) 10/19/2019   Depression 10/19/2019    Patient Centered Plan: Patient is on the following Treatment Plan(s):  Anxiety and Depression   Referrals to Alternative Service(s): Referred to Alternative Service(s):   Place:   Date:   Time:    Referred to Alternative Service(s):   Place:   Date:   Time:    Referred to Alternative Service(s):   Place:   Date:   Time:    Referred to Alternative Service(s):   Place:   Date:   Time:      @[facility]COLLABOFCARE @  [place], Counselor, LCAS-A

## 2021-09-03 NOTE — ED Provider Notes (Signed)
Emergency Medicine Observation Re-evaluation Note  Ralph Fitzgerald is a 36 y.o. male, seen on rounds today.  Pt initially presented to the ED for complaints of Altered Mental Status Currently, the patient is resting calmly.  Physical Exam  BP 120/68    Pulse 85    Temp 98.1 F (36.7 C) (Oral)    Resp 17    Ht 6\' 2"  (1.88 m)    Wt 61.2 kg    SpO2 99%    BMI 17.33 kg/m  Physical Exam General: no acute distress Psych: calm  ED Course / MDM  EKG:   I have reviewed the labs performed to date as well as medications administered while in observation.  Recent changes in the last 24 hours include none.  Plan  Current plan is for inpatient. Ralph Fitzgerald is under involuntary commitment.      Ralph Aly, MD 09/03/21 [phone]

## 2021-09-03 NOTE — ED Notes (Addendum)
Discharged with [facility] to [facility]. Left with all of belongings, paperwork and transfer forms. Pt alert and oriented X4, cooperative, RR even and unlabored, color WNL. Pt in NAD.

## 2021-09-03 NOTE — ED Notes (Signed)
IVC/patient accepted to [facility] on 09-03-21

## 2022-07-25 ENCOUNTER — Other Ambulatory Visit: Payer: Self-pay

## 2022-07-25 ENCOUNTER — Emergency Department: Payer: BC Managed Care – PPO

## 2022-07-25 ENCOUNTER — Emergency Department
Admission: EM | Admit: 2022-07-25 | Discharge: 2022-07-25 | Disposition: A | Discharge status: Home / Self Care | Payer: BC Managed Care – PPO | Attending: Emergency Medicine | Admitting: Emergency Medicine

## 2022-07-25 ENCOUNTER — Emergency Department
Admission: EM | Admit: 2022-07-25 | Discharge: 2022-07-25 | Disposition: A | Discharge status: Home / Self Care | Payer: BC Managed Care – PPO | Source: Home / Self Care | Attending: Emergency Medicine | Admitting: Emergency Medicine

## 2022-07-25 DIAGNOSIS — Y9259 Other trade areas as the place of occurrence of the external cause: Secondary | ICD-10-CM | POA: Insufficient documentation

## 2022-07-25 DIAGNOSIS — S6991XA Unspecified injury of right wrist, hand and finger(s), initial encounter: Secondary | ICD-10-CM | POA: Diagnosis present

## 2022-07-25 DIAGNOSIS — S52501A Unspecified fracture of the lower end of right radius, initial encounter for closed fracture: Secondary | ICD-10-CM | POA: Insufficient documentation

## 2022-07-25 DIAGNOSIS — R4182 Altered mental status, unspecified: Secondary | ICD-10-CM | POA: Insufficient documentation

## 2022-07-25 LAB — URINALYSIS, ROUTINE W REFLEX MICROSCOPIC
Bilirubin Urine: NEGATIVE
Glucose, UA: NEGATIVE mg/dL
Ketones, ur: NEGATIVE mg/dL
Leukocytes,Ua: NEGATIVE
Nitrite: NEGATIVE
Protein, ur: NEGATIVE mg/dL
Specific Gravity, Urine: 1.016 (ref 1.005–1.030)
Squamous Epithelial / HPF: NONE SEEN /HPF (ref 0–5)
pH: 5 (ref 5.0–8.0)

## 2022-07-25 LAB — COMPREHENSIVE METABOLIC PANEL
ALT: 21 U/L (ref 0–44)
AST: 25 U/L (ref 15–41)
Albumin: 4.2 g/dL (ref 3.5–5.0)
Alkaline Phosphatase: 59 U/L (ref 38–126)
Anion gap: 8 (ref 5–15)
BUN: 16 mg/dL (ref 6–20)
CO2: 27 mmol/L (ref 22–32)
Calcium: 8.5 mg/dL — ABNORMAL LOW (ref 8.9–10.3)
Chloride: 109 mmol/L (ref 98–111)
Creatinine, Ser: 1.19 mg/dL (ref 0.61–1.24)
GFR, Estimated: >60 mL/min (ref >60)
Glucose, Bld: 96 mg/dL (ref 70–99)
Potassium: 3.6 mmol/L (ref 3.5–5.1)
Sodium: 144 mmol/L (ref 135–145)
Total Bilirubin: 0.5 mg/dL (ref 0.3–1.2)
Total Protein: 7.1 g/dL (ref 6.5–8.1)

## 2022-07-25 LAB — CBC WITH DIFFERENTIAL/PLATELET
Abs Immature Granulocytes: 0.02 10*3/uL (ref 0.00–0.07)
Basophils Absolute: 0 10*3/uL (ref 0.0–0.1)
Basophils Relative: 0 %
Eosinophils Absolute: 0.3 10*3/uL (ref 0.0–0.5)
Eosinophils Relative: 4 %
HCT: 39.6 % (ref 39.0–52.0)
Hemoglobin: 13.1 g/dL (ref 13.0–17.0)
Immature Granulocytes: 0 %
Lymphocytes Relative: 32 %
Lymphs Abs: 2.5 10*3/uL (ref 0.7–4.0)
MCH: 31.5 pg (ref 26.0–34.0)
MCHC: 33.1 g/dL (ref 30.0–36.0)
MCV: 95.2 fL (ref 80.0–100.0)
Monocytes Absolute: 0.6 10*3/uL (ref 0.1–1.0)
Monocytes Relative: 8 %
Neutro Abs: 4.3 10*3/uL (ref 1.7–7.7)
Neutrophils Relative %: 56 %
Platelets: 290 10*3/uL (ref 150–400)
RBC: 4.16 MIL/uL — ABNORMAL LOW (ref 4.22–5.81)
RDW: 14.2 % (ref 11.5–15.5)
WBC: 7.8 10*3/uL (ref 4.0–10.5)
nRBC: 0 % (ref 0.0–0.2)

## 2022-07-25 LAB — URINE DRUG SCREEN, QUALITATIVE (ARMC ONLY)
Amphetamines, Ur Screen: NOT DETECTED
Barbiturates, Ur Screen: NOT DETECTED
Benzodiazepine, Ur Scrn: POSITIVE — AB
Cannabinoid 50 Ng, Ur ~~LOC~~: NOT DETECTED
Cocaine Metabolite,Ur ~~LOC~~: POSITIVE — AB
MDMA (Ecstasy)Ur Screen: NOT DETECTED
Methadone Scn, Ur: NOT DETECTED
Opiate, Ur Screen: NOT DETECTED
Phencyclidine (PCP) Ur S: NOT DETECTED
Tricyclic, Ur Screen: POSITIVE — AB

## 2022-07-25 LAB — ACETAMINOPHEN LEVEL: Acetaminophen (Tylenol), Serum: 14 ug/mL (ref 10–30)

## 2022-07-25 LAB — ETHANOL: Alcohol, Ethyl (B): 177 mg/dL — ABNORMAL HIGH (ref <10)

## 2022-07-25 LAB — SALICYLATE LEVEL: Salicylate Lvl: <7 mg/dL — ABNORMAL LOW (ref 7.0–30.0)

## 2022-07-25 LAB — CBG MONITORING, ED: Glucose-Capillary: 88 mg/dL (ref 70–99)

## 2022-07-25 MED ORDER — LACTATED RINGERS IV BOLUS
2000.0000 mL | Freq: Once | INTRAVENOUS | Status: AC
  Administered 2022-07-25: 2000 mL via INTRAVENOUS

## 2022-07-25 MED ORDER — ONDANSETRON 4 MG PO TBDP: 4.0000 mg | ORAL_TABLET | Freq: Four times a day (QID) | ORAL | 0 refills | Status: DC | PRN

## 2022-07-25 MED ORDER — OXYCODONE-ACETAMINOPHEN 5-325 MG PO TABS: 1.0000 | ORAL_TABLET | Freq: Once | ORAL | Status: DC

## 2022-07-25 MED ORDER — OXYCODONE-ACETAMINOPHEN 5-325 MG PO TABS: 2.0000 | ORAL_TABLET | Freq: Four times a day (QID) | ORAL | 0 refills | Status: DC | PRN

## 2022-07-25 MED ORDER — OXYCODONE-ACETAMINOPHEN 5-325 MG PO TABS
1.0000 | ORAL_TABLET | Freq: Once | ORAL | Status: AC
  Administered 2022-07-25: 1 via ORAL
  Filled 2022-07-25: qty 1

## 2022-07-25 MED ORDER — LACTATED RINGERS IV BOLUS: 1000.0000 mL | Freq: Once | INTRAVENOUS | Status: DC

## 2022-07-25 MED ORDER — ACETAMINOPHEN 325 MG PO TABS
650.0000 mg | ORAL_TABLET | Freq: Once | ORAL | Status: AC
  Administered 2022-07-25: 650 mg via ORAL
  Filled 2022-07-25: qty 2

## 2022-07-25 MED ORDER — NALOXONE HCL 2 MG/2ML IJ SOSY
PREFILLED_SYRINGE | INTRAMUSCULAR | Status: AC
  Administered 2022-07-25: 2 mg via INTRAVENOUS
  Filled 2022-07-25: qty 2

## 2022-07-25 MED ORDER — ONDANSETRON 4 MG PO TBDP: 4.0000 mg | ORAL_TABLET | Freq: Once | ORAL | Status: DC

## 2022-07-25 MED ORDER — NALOXONE HCL 2 MG/2ML IJ SOSY: 2.0000 mg | PREFILLED_SYRINGE | INTRAMUSCULAR | Status: AC

## 2022-07-25 NOTE — ED Notes (Signed)
ED tech bedside to redo/rewrap pt splint.

## 2022-07-25 NOTE — Discharge Instructions (Addendum)
You were already prescribed Percocet for pain and Zofran for nausea at your ED visit earlier this morning.  These prescriptions were sent to [facility] on [address].  We have provided orthopedic surgery referral for your arm.  We have also provided a list of resources for substance abuse counseling if needed.  Return to the ER for any new or worsening symptoms that concern you.
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
## 2022-07-25 NOTE — Discharge Instructions (Addendum)
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
## 2022-07-25 NOTE — ED Notes (Signed)
MD bedside to evaluate pt splint. Pt had already left.

## 2022-07-25 NOTE — ED Provider Notes (Signed)
-----------------------------------------   2:32 PM on 07/25/2022 -----------------------------------------  I took over care of this patient from Dr. Tamala Julian.  The patient is now alert and oriented and clinically sober.  He reports pain to the right arm that is splinted but denies any other acute pain.  I have given a dose of analgesia here.  He was already prescribed Percocet during the ED visit earlier this morning.  I counseled him on the results of the workup.  He will need to follow-up with orthopedics.  Return precautions given, and he expresses understanding.   Arta Silence, MD 07/25/22 1433
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
## 2022-11-23 DIAGNOSIS — Z01818 Encounter for other preprocedural examination: Secondary | ICD-10-CM | POA: Diagnosis not present

## 2023-01-02 ENCOUNTER — Ambulatory Visit
Admission: EM | Admit: 2023-01-02 | Discharge: 2023-01-02 | Disposition: A | Discharge status: Home / Self Care | Payer: Medicaid Other | Attending: Urgent Care | Admitting: Urgent Care

## 2023-01-02 DIAGNOSIS — L03115 Cellulitis of right lower limb: Secondary | ICD-10-CM

## 2023-01-02 MED ORDER — SULFAMETHOXAZOLE-TRIMETHOPRIM 800-160 MG PO TABS: 1.0000 | ORAL_TABLET | Freq: Two times a day (BID) | ORAL | 0 refills | Status: AC

## 2023-01-02 NOTE — Discharge Instructions (Signed)
Follow up here or with your primary care provider if your symptoms are worsening or not improving with treatment.     

## 2023-01-02 NOTE — ED Provider Notes (Signed)
Ralph Fitzgerald    CSN: [id number] Arrival date & time: 01/02/23  0901      History   Chief Complaint Chief Complaint  Patient presents with   Abscess    Left lower leg - Entered by patient    HPI Ralph Fitzgerald is a 37 y.o. male.    Abscess   Presents with complaint of "abscess" to LLE. Symptoms x 2 days. He endorses recent hx of additional abscesses resolved by his significant other (RN) when she incised them at home.  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  No past medical history on file.  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   Substance induced mood disorder (HCC) 07/09/2020   Generalized anxiety disorder 07/09/2020   Alcohol intoxication (HCC) 07/09/2020   Wound infection    Cellulitis of left leg    Cellulitis 10/19/2019   Abnormal LFTs 10/19/2019   GERD (gastroesophageal reflux disease) 10/19/2019   Depression 10/19/2019    Past Surgical History:  Procedure Laterality Date   IRRIGATION AND DEBRIDEMENT FOOT Left 10/20/2019   Procedure: IRRIGATION AND DEBRIDEMENT FOOT;  Surgeon: Linus Galas, DPM;  Location: [facility] ORS;  Service: Podiatry;  Laterality: Left;       Home Medications    Prior to Admission medications   Medication Sig Start Date End Date Taking? Authorizing Provider  buPROPion (WELLBUTRIN XL) 150 MG 24 hr tablet Take 1 tablet by mouth every morning. 06/03/22   [provider]  cetirizine (ZYRTEC) 10 MG tablet Take 10 mg by mouth daily.    [provider]  clonazePAM (KLONOPIN) 1 MG tablet Take 1 mg by mouth 2 (two) times daily as needed for anxiety. Patient not taking: Reported on 07/25/2022 10/09/19   [provider]  escitalopram (LEXAPRO) 10 MG tablet Take 10 mg by mouth daily. Patient not taking: Reported on 07/25/2022 06/03/22   [provider]   escitalopram (LEXAPRO) 20 MG tablet Take 20 mg by mouth daily.    [provider]  gabapentin (NEURONTIN) 600 MG tablet Take 600 mg by mouth 3 (three) times daily. 06/03/22   [provider]  hydrOXYzine (ATARAX) 25 MG tablet Take 25 mg by mouth 2 (two) times daily as needed. Patient not taking: Reported on 07/25/2022 03/03/22   [provider]  methocarbamol (ROBAXIN) 500 MG tablet Take 500 mg by mouth 3 (three) times daily. 06/03/22   [provider]  Multiple Vitamin (MULTIVITAMIN) tablet Take 1 tablet by mouth daily.    [provider]  ondansetron (ZOFRAN-ODT) 4 MG disintegrating tablet Take 1 tablet (4 mg total) by mouth every 6 (six) hours as needed for nausea or vomiting. Patient not taking: Reported on 07/25/2022 07/25/22   Ward, Layla Maw, DO  oxyCODONE-acetaminophen (PERCOCET) 5-325 MG tablet Take 2 tablets by mouth every 6 (six) hours as needed for severe pain. Patient not taking: Reported on 07/25/2022 07/25/22 07/25/23  Ward, Layla Maw, DO  pantoprazole (PROTONIX) 40 MG tablet Take 40 mg by mouth daily. 12/03/20 07/25/22  [provider]  PARoxetine (PAXIL) 20 MG tablet Take 20 mg by mouth daily. Patient not taking: Reported on 07/25/2022 08/22/21   [provider]  QUEtiapine (SEROQUEL) 100 MG tablet Take 100 mg by mouth at bedtime. 06/03/22 06/03/23  [provider]    Family History No family history on  file.  Social History Social History   Tobacco Use   Smoking status: Never   Smokeless tobacco: Never  Vaping Use   Vaping Use: Some days   Substances: Flavoring  Substance Use Topics   Alcohol use: Yes    Comment: occasional   Drug use: Yes    Types: Cocaine, Marijuana     Allergies   Meloxicam, Penicillins, and Diclofenac   Review of Systems Review of Systems   Physical Exam Triage Vital Signs ED Triage Vitals  Enc Vitals Group     BP      Pulse      Resp      Temp      Temp src       SpO2      Weight      Height      Head Circumference      Peak Flow      Pain Score      Pain Loc      Pain Edu?      Excl. in GC?    No data found.  Updated Vital Signs There were no vitals taken for this visit.  Visual Acuity Right Eye Distance:   Left Eye Distance:   Bilateral Distance:    Right Eye Near:   Left Eye Near:    Bilateral Near:     Physical Exam Vitals reviewed.  Constitutional:      Appearance: Normal appearance.  Musculoskeletal:       Legs:  Skin:    General: Skin is warm and dry.     Findings: Erythema and lesion present.  Neurological:     General: No focal deficit present.     Mental Status: He is alert and oriented to person, place, and time.  Psychiatric:        Mood and Affect: Mood normal.        Behavior: Behavior normal.      UC Treatments / Results  Labs (all labs ordered are listed, but only abnormal results are displayed) Labs Reviewed - No data to display  EKG   Radiology No results found.  Procedures Incision and Drainage  Date/Time: 01/02/2023 10:31 AM  Performed by: Charma Igo, FNP Authorized by: Charma Igo, FNP   Consent:    Consent obtained:  Verbal   Consent given by:  Patient   Risks discussed:  Bleeding, incomplete drainage and pain   Alternatives discussed:  No treatment and alternative treatment (discussed antibiotic treatment) Universal protocol:    Procedure explained and questions answered to patient or proxy's satisfaction: yes     Relevant documents present and verified: yes     Test results available : no     Imaging studies available: no     Required blood products, implants, devices, and special equipment available: no     Site/side marked: no     Immediately prior to procedure, a time out was called: yes     Patient identity confirmed:  Verbally with patient Location:    Type:  Abscess   Size:  5 cm   Location:  Lower extremity   Lower extremity location:  Knee   Knee  location:  L knee Pre-procedure details:    Skin preparation:  Povidone-iodine Anesthesia:    Anesthesia method:  Local infiltration   Local anesthetic:  Lidocaine 1% WITH epi Procedure type:    Complexity:  Simple Procedure details:    Ultrasound guidance: no     Needle aspiration:  no     Incision types:  Stab incision   Incision depth:  Dermal   Wound management:  Probed and deloculated   Drainage:  Bloody   Drainage amount:  Scant   Wound treatment:  Wound left open   Packing materials:  None Post-procedure details:    Procedure completion:  Tolerated Comments:     Instructed patient to use warm compresses with dissolved Epsom salts at least twice a day.  Take antibiotic as prescribed.  Follow-up if necessary.  (including critical care time)  Medications Ordered in UC Medications - No data to display  Initial Impression / Assessment and Plan / UC Course  I have reviewed the triage vital signs and the nursing notes.  Pertinent labs & imaging results that were available during my care of the patient were reviewed by me and considered in my medical decision making (see chart for details).   AXLE PARFAIT is a 37 y.o. male presenting with LLE cellulitis. Patient is afebrile without recent antipyretics, satting well on room air. Overall is well appearing though non-toxic, well hydrated, without respiratory distress. Pulmonary exam is unremarkable.   Erythema and edema inferior to the left patella. Central punctum is present, possible the original injury.  No discharge.  Tissue appears firm but difficult to evaluate because of exquisite tenderness with palpation.  Approximately 4-5 cm's diameter.  Reviewed relevant chart history.  Attempting I&D as requested by patient to relieve significant feeling of "pressure".  Unable to determine if wound is fluctuant because it is exquisitely tender to palpation.  Will prescribe antibiotic to try to resolve lesion.  Suggested bleach water  bath treatment for chronic skin infections.  Counseled patient on potential for adverse effects with medications prescribed/recommended today, ER and return-to-clinic precautions discussed, patient verbalized understanding and agreement with care plan.  Final Clinical Impressions(s) / UC Diagnoses   Final diagnoses:  None   Discharge Instructions   None    ED Prescriptions   None    PDMP not reviewed this encounter.   Truitt, [place], [place] 01/02/23 [phone]

## 2023-01-02 NOTE — ED Notes (Signed)
Patient called to come into the urgent care (patient was waiting in car)

## 2023-01-02 NOTE — ED Triage Notes (Signed)
Patient c/o swelling and redness (abscess) to left knee that was noticed 2 days ago.

## 2023-05-10 ENCOUNTER — Encounter: Payer: Self-pay | Admitting: Psychiatry

## 2023-05-10 ENCOUNTER — Ambulatory Visit (INDEPENDENT_AMBULATORY_CARE_PROVIDER_SITE_OTHER): Payer: Medicaid Other | Admitting: Psychiatry

## 2023-05-10 VITALS — BP 133/81 | HR 111 | Temp 99.5°F | Ht 70.0 in | Wt 168.8 lb

## 2023-05-10 DIAGNOSIS — F39 Unspecified mood [affective] disorder: Secondary | ICD-10-CM

## 2023-05-10 DIAGNOSIS — F1110 Opioid abuse, uncomplicated: Secondary | ICD-10-CM

## 2023-05-10 DIAGNOSIS — F1491 Cocaine use, unspecified, in remission: Secondary | ICD-10-CM

## 2023-05-10 DIAGNOSIS — F0630 Mood disorder due to known physiological condition, unspecified: Secondary | ICD-10-CM
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
## 2023-05-10 NOTE — Patient Instructions (Addendum)
  [place], [place] [address] [place], [place] [address] Phone: [phone] Fax: [phone]  [email or web address] [employer]. [address] [place], [place], [place] [address]  [phone] Open  Closes 9 PM  [email or web address] [address] [facility] [address], [place], [place] [address]   [phone]

## 2023-05-11 ENCOUNTER — Encounter: Payer: Self-pay | Admitting: Psychiatry

## 2023-05-11 DIAGNOSIS — F1491 Cocaine use, unspecified, in remission: Secondary | ICD-10-CM | POA: Insufficient documentation

## 2023-06-05 ENCOUNTER — Encounter (HOSPITAL_COMMUNITY): Payer: Self-pay | Admitting: Pulmonary Disease

## 2023-06-05 ENCOUNTER — Emergency Department (HOSPITAL_COMMUNITY): Payer: Medicaid Other

## 2023-06-05 ENCOUNTER — Inpatient Hospital Stay (HOSPITAL_COMMUNITY)
Admission: EM | Admit: 2023-06-05 | Discharge: 2023-06-06 | DRG: 917 | Disposition: A | Discharge status: Other Acute Inpatient Hospital | Payer: Medicaid Other | Attending: Emergency Medicine | Admitting: Emergency Medicine

## 2023-06-05 ENCOUNTER — Inpatient Hospital Stay (HOSPITAL_COMMUNITY): Payer: Medicaid Other

## 2023-06-05 ENCOUNTER — Other Ambulatory Visit: Payer: Self-pay

## 2023-06-05 DIAGNOSIS — Z813 Family history of other psychoactive substance abuse and dependence: Secondary | ICD-10-CM | POA: Diagnosis not present

## 2023-06-05 DIAGNOSIS — G8929 Other chronic pain: Secondary | ICD-10-CM | POA: Diagnosis present

## 2023-06-05 DIAGNOSIS — R404 Transient alteration of awareness: Secondary | ICD-10-CM | POA: Diagnosis present

## 2023-06-05 DIAGNOSIS — R4189 Other symptoms and signs involving cognitive functions and awareness: Principal | ICD-10-CM

## 2023-06-05 DIAGNOSIS — F32A Depression, unspecified: Secondary | ICD-10-CM | POA: Diagnosis present

## 2023-06-05 DIAGNOSIS — G929 Unspecified toxic encephalopathy: Secondary | ICD-10-CM

## 2023-06-05 DIAGNOSIS — R402431 Glasgow coma scale score 3-8, in the field [EMT or ambulance]: Secondary | ICD-10-CM

## 2023-06-05 DIAGNOSIS — R9401 Abnormal electroencephalogram [EEG]: Secondary | ICD-10-CM | POA: Diagnosis not present

## 2023-06-05 DIAGNOSIS — F1110 Opioid abuse, uncomplicated: Secondary | ICD-10-CM | POA: Diagnosis present

## 2023-06-05 DIAGNOSIS — Z880 Allergy status to penicillin: Secondary | ICD-10-CM | POA: Diagnosis not present

## 2023-06-05 DIAGNOSIS — F419 Anxiety disorder, unspecified: Secondary | ICD-10-CM | POA: Diagnosis present

## 2023-06-05 DIAGNOSIS — Z791 Long term (current) use of non-steroidal anti-inflammatories (NSAID): Secondary | ICD-10-CM

## 2023-06-05 DIAGNOSIS — G928 Other toxic encephalopathy: Secondary | ICD-10-CM | POA: Diagnosis present

## 2023-06-05 DIAGNOSIS — Z79899 Other long term (current) drug therapy: Secondary | ICD-10-CM | POA: Diagnosis not present

## 2023-06-05 DIAGNOSIS — T40604A Poisoning by unspecified narcotics, undetermined, initial encounter: Secondary | ICD-10-CM

## 2023-06-05 DIAGNOSIS — Z9151 Personal history of suicidal behavior: Secondary | ICD-10-CM

## 2023-06-05 DIAGNOSIS — J9601 Acute respiratory failure with hypoxia: Secondary | ICD-10-CM | POA: Diagnosis present

## 2023-06-05 DIAGNOSIS — T40602A Poisoning by unspecified narcotics, intentional self-harm, initial encounter: Secondary | ICD-10-CM | POA: Diagnosis present

## 2023-06-05 DIAGNOSIS — R000 Tachycardia, unspecified: Secondary | ICD-10-CM | POA: Diagnosis present

## 2023-06-05 DIAGNOSIS — F332 Major depressive disorder, recurrent severe without psychotic features: Secondary | ICD-10-CM | POA: Diagnosis not present

## 2023-06-05 DIAGNOSIS — F17290 Nicotine dependence, other tobacco product, uncomplicated: Secondary | ICD-10-CM | POA: Diagnosis present

## 2023-06-05 DIAGNOSIS — Z630 Problems in relationship with spouse or partner: Secondary | ICD-10-CM | POA: Diagnosis not present

## 2023-06-05 DIAGNOSIS — Z886 Allergy status to analgesic agent status: Secondary | ICD-10-CM | POA: Diagnosis not present

## 2023-06-05 DIAGNOSIS — T40601A Poisoning by unspecified narcotics, accidental (unintentional), initial encounter: Secondary | ICD-10-CM | POA: Diagnosis present

## 2023-06-05 LAB — I-STAT ARTERIAL BLOOD GAS, ED
Acid-Base Excess: 1 mmol/L (ref 0.0–2.0)
Bicarbonate: 27.6 mmol/L (ref 20.0–28.0)
Calcium, Ion: 1.15 mmol/L (ref 1.15–1.40)
HCT: 30 % — ABNORMAL LOW (ref 39.0–52.0)
Hemoglobin: 10.2 g/dL — ABNORMAL LOW (ref 13.0–17.0)
O2 Saturation: 100 %
Patient temperature: 94.8
Potassium: 3.5 mmol/L (ref 3.5–5.1)
Sodium: 145 mmol/L (ref 135–145)
TCO2: 29 mmol/L (ref 22–32)
pCO2 arterial: 46.1 mm[Hg] (ref 32–48)
pH, Arterial: 7.375 (ref 7.35–7.45)
pO2, Arterial: 413 mm[Hg] — ABNORMAL HIGH (ref 83–108)

## 2023-06-05 LAB — RAPID URINE DRUG SCREEN, HOSP PERFORMED
Amphetamines: NOT DETECTED
Barbiturates: NOT DETECTED
Benzodiazepines: POSITIVE — AB
Cocaine: NOT DETECTED
Opiates: POSITIVE — AB
Tetrahydrocannabinol: NOT DETECTED

## 2023-06-05 LAB — TROPONIN I (HIGH SENSITIVITY)
Troponin I (High Sensitivity): 5 ng/L (ref <18)
Troponin I (High Sensitivity): 5 ng/L (ref <18)

## 2023-06-05 LAB — GLUCOSE, CAPILLARY
Glucose-Capillary: 88 mg/dL (ref 70–99)
Glucose-Capillary: 92 mg/dL (ref 70–99)
Glucose-Capillary: 77 mg/dL (ref 70–99)
Glucose-Capillary: 66 mg/dL — ABNORMAL LOW (ref 70–99)
Glucose-Capillary: 121 mg/dL — ABNORMAL HIGH (ref 70–99)
Glucose-Capillary: 87 mg/dL (ref 70–99)

## 2023-06-05 LAB — BASIC METABOLIC PANEL
Anion gap: 11 (ref 5–15)
BUN: 13 mg/dL (ref 6–20)
CO2: 23 mmol/L (ref 22–32)
Calcium: 7.9 mg/dL — ABNORMAL LOW (ref 8.9–10.3)
Chloride: 110 mmol/L (ref 98–111)
Creatinine, Ser: 0.81 mg/dL (ref 0.61–1.24)
GFR, Estimated: >60 mL/min (ref >60)
Glucose, Bld: 86 mg/dL (ref 70–99)
Potassium: 3.5 mmol/L (ref 3.5–5.1)
Sodium: 144 mmol/L (ref 135–145)

## 2023-06-05 LAB — COOXEMETRY PANEL
Carboxyhemoglobin: 2.9 % — ABNORMAL HIGH (ref 0.5–1.5)
Methemoglobin: <0.7 % (ref 0.0–1.5)
O2 Saturation: 89.4 %
Total hemoglobin: 11.5 g/dL — ABNORMAL LOW (ref 12.0–16.0)

## 2023-06-05 LAB — TSH: TSH: 1.525 u[IU]/mL (ref 0.350–4.500)

## 2023-06-05 LAB — CBG MONITORING, ED: Glucose-Capillary: 84 mg/dL (ref 70–99)

## 2023-06-05 LAB — ETHANOL: Alcohol, Ethyl (B): 165 mg/dL — ABNORMAL HIGH (ref <10)

## 2023-06-05 LAB — CBC
HCT: 32.6 % — ABNORMAL LOW (ref 39.0–52.0)
Hemoglobin: 10.7 g/dL — ABNORMAL LOW (ref 13.0–17.0)
MCH: 31.1 pg (ref 26.0–34.0)
MCHC: 32.8 g/dL (ref 30.0–36.0)
MCV: 94.8 fL (ref 80.0–100.0)
Platelets: 158 10*3/uL (ref 150–400)
RBC: 3.44 MIL/uL — ABNORMAL LOW (ref 4.22–5.81)
RDW: 12.8 % (ref 11.5–15.5)
WBC: 3.5 10*3/uL — ABNORMAL LOW (ref 4.0–10.5)
nRBC: 0 % (ref 0.0–0.2)

## 2023-06-05 LAB — ACETAMINOPHEN LEVEL: Acetaminophen (Tylenol), Serum: 18 ug/mL (ref 10–30)

## 2023-06-05 LAB — MRSA NEXT GEN BY PCR, NASAL: MRSA by PCR Next Gen: NOT DETECTED

## 2023-06-05 LAB — MAGNESIUM: Magnesium: 2.3 mg/dL (ref 1.7–2.4)

## 2023-06-05 LAB — HIV ANTIBODY (ROUTINE TESTING W REFLEX): HIV Screen 4th Generation wRfx: NONREACTIVE

## 2023-06-05 LAB — SALICYLATE LEVEL: Salicylate Lvl: <7 mg/dL — ABNORMAL LOW (ref 7.0–30.0)

## 2023-06-05 LAB — CREATININE, SERUM
Creatinine, Ser: 0.86 mg/dL (ref 0.61–1.24)
GFR, Estimated: >60 mL/min (ref >60)

## 2023-06-05 MED ORDER — POLYETHYLENE GLYCOL 3350 17 G PO PACK: 17.0000 g | PACK | Freq: Every day | ORAL | Status: DC | PRN

## 2023-06-05 MED ORDER — SODIUM CHLORIDE 0.9 % IV BOLUS
1000.0000 mL | Freq: Once | INTRAVENOUS | Status: AC
  Administered 2023-06-05: 1000 mL via INTRAVENOUS

## 2023-06-05 MED ORDER — LORAZEPAM 2 MG/ML IJ SOLN
INTRAMUSCULAR | Status: AC
  Filled 2023-06-05: qty 1

## 2023-06-05 MED ORDER — LACTATED RINGERS IV BOLUS
1000.0000 mL | Freq: Once | INTRAVENOUS | Status: AC
  Administered 2023-06-05: 1000 mL via INTRAVENOUS

## 2023-06-05 MED ORDER — MIDAZOLAM HCL 2 MG/2ML IJ SOLN: 1.0000 mg | INTRAMUSCULAR | Status: DC | PRN

## 2023-06-05 MED ORDER — ENOXAPARIN SODIUM 40 MG/0.4ML IJ SOSY
40.0000 mg | PREFILLED_SYRINGE | INTRAMUSCULAR | Status: DC
  Administered 2023-06-05 – 2023-06-06 (×2): 40 mg via SUBCUTANEOUS
  Filled 2023-06-05 (×2): qty 0.4

## 2023-06-05 MED ORDER — NALOXONE HCL 0.4 MG/ML IJ SOLN
0.4000 mg | Freq: Once | INTRAMUSCULAR | Status: AC
  Administered 2023-06-05: 0.4 mg via INTRAVENOUS

## 2023-06-05 MED ORDER — ACETAMINOPHEN 325 MG PO TABS
650.0000 mg | ORAL_TABLET | Freq: Four times a day (QID) | ORAL | Status: DC | PRN
  Administered 2023-06-05 – 2023-06-06 (×2): 650 mg via ORAL
  Filled 2023-06-05 (×2): qty 2

## 2023-06-05 MED ORDER — POLYETHYLENE GLYCOL 3350 17 G PO PACK
17.0000 g | PACK | Freq: Every day | ORAL | Status: DC
  Administered 2023-06-05: 17 g
  Filled 2023-06-05: qty 1

## 2023-06-05 MED ORDER — SODIUM CHLORIDE 0.9 % IV SOLN: 250.0000 mL | INTRAVENOUS | Status: AC

## 2023-06-05 MED ORDER — FENTANYL CITRATE PF 50 MCG/ML IJ SOSY
50.0000 ug | PREFILLED_SYRINGE | INTRAMUSCULAR | Status: DC | PRN
  Administered 2023-06-05: 50 ug via INTRAVENOUS
  Filled 2023-06-05: qty 1

## 2023-06-05 MED ORDER — LORAZEPAM 2 MG/ML IJ SOLN: 2.0000 mg | Freq: Once | INTRAMUSCULAR | Status: AC

## 2023-06-05 MED ORDER — SUCCINYLCHOLINE CHLORIDE 20 MG/ML IJ SOLN
INTRAMUSCULAR | Status: DC | PRN
  Administered 2023-06-05: 100 mg via INTRAVENOUS

## 2023-06-05 MED ORDER — ETOMIDATE 2 MG/ML IV SOLN
INTRAVENOUS | Status: DC | PRN
  Administered 2023-06-05: 20 mg via INTRAVENOUS

## 2023-06-05 MED ORDER — ORAL CARE MOUTH RINSE
15.0000 mL | OROMUCOSAL | Status: DC
  Administered 2023-06-05 (×7): 15 mL via OROMUCOSAL

## 2023-06-05 MED ORDER — DOCUSATE SODIUM 50 MG/5ML PO LIQD
100.0000 mg | Freq: Two times a day (BID) | ORAL | Status: DC
  Administered 2023-06-05: 100 mg
  Filled 2023-06-05: qty 10

## 2023-06-05 MED ORDER — FENTANYL CITRATE PF 50 MCG/ML IJ SOSY: 50.0000 ug | PREFILLED_SYRINGE | INTRAMUSCULAR | Status: DC | PRN

## 2023-06-05 MED ORDER — FAMOTIDINE 20 MG PO TABS
20.0000 mg | ORAL_TABLET | Freq: Two times a day (BID) | ORAL | Status: DC
  Administered 2023-06-05: 20 mg
  Filled 2023-06-05 (×2): qty 1

## 2023-06-05 MED ORDER — PROPOFOL 1000 MG/100ML IV EMUL
0.0000 ug/kg/min | INTRAVENOUS | Status: DC
  Administered 2023-06-05: 15 ug/kg/min via INTRAVENOUS

## 2023-06-05 MED ORDER — CHLORHEXIDINE GLUCONATE CLOTH 2 % EX PADS
6.0000 | MEDICATED_PAD | Freq: Every day | CUTANEOUS | Status: DC
  Administered 2023-06-05 – 2023-06-06 (×2): 6 via TOPICAL

## 2023-06-05 MED ORDER — LACTATED RINGERS IV SOLN
INTRAVENOUS | Status: AC
Start: 2023-06-05 — End: 2023-06-06

## 2023-06-05 MED ORDER — NOREPINEPHRINE 4 MG/250ML-% IV SOLN
2.0000 ug/min | INTRAVENOUS | Status: DC
  Administered 2023-06-05: 2 ug/min via INTRAVENOUS
  Filled 2023-06-05: qty 250

## 2023-06-05 MED ORDER — DOCUSATE SODIUM 100 MG PO CAPS: 100.0000 mg | ORAL_CAPSULE | Freq: Two times a day (BID) | ORAL | Status: DC | PRN

## 2023-06-05 MED ORDER — ORAL CARE MOUTH RINSE: 15.0000 mL | OROMUCOSAL | Status: DC | PRN

## 2023-06-05 NOTE — Progress Notes (Signed)
Pt moving to new room, eeg to be attempted once pt arrives

## 2023-06-05 NOTE — Progress Notes (Signed)
EEG complete - results pending Ralph Fitzgerald conducting stat read to determine if study needs to run LTM

## 2023-06-05 NOTE — Plan of Care (Signed)
Problem: Clinical Measurements: Goal: Ability to maintain clinical measurements within normal limits will improve Outcome: Progressing Goal: Will remain free from infection Outcome: Progressing Goal: Diagnostic test results will improve Outcome: Progressing Goal: Respiratory complications will improve Outcome: Progressing Goal: Cardiovascular complication will be avoided Outcome: Progressing   Problem: Pain Management: Goal: General experience of comfort will improve Outcome: Progressing
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
## 2023-06-05 NOTE — Progress Notes (Signed)
Pt to CT2 and back to ED18 on vent with RT and 2 RNs, no complications.
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
## 2023-06-05 NOTE — Progress Notes (Signed)
1640 wasted patients medication with Supervisor Alison Stalling in medication room 2 percocet pills, 13 Soma pills and 6 hydrocodone pills.

## 2023-06-05 NOTE — ED Notes (Signed)
RN applied warm blankets to patient

## 2023-06-05 NOTE — Progress Notes (Signed)
Per RN, pt getting settled in new room, attempting eeg as schedule allows

## 2023-06-05 NOTE — Progress Notes (Signed)
eLink Physician-Brief Progress Note Patient Name: DORRIEN OBIE DOB: Jun 14, 1986 MRN: [id number]   Date of Service  06/05/2023  HPI/Events of Note  Blood pressure low with MAP 60  eICU Interventions  Fluid bolus ordered     Intervention Category Major Interventions: Hypotension - evaluation and management  Henry Russel, P 06/05/2023, 5:08 AM

## 2023-06-05 NOTE — Procedures (Signed)
History: 37 year old with drug overdose  Sedation: Propofol  Patient State: Comatose  Technique: This EEG was acquired with electrodes placed according to the International 10-20 electrode system (including Fp1, Fp2, F3, F4, C3, C4, P3, P4, O1, O2, T3, T4, T5, T6, A1, A2, Fz, Cz, Pz). The following electrodes were missing or displaced: none.  Background: The background consists of high-voltage generalized beta activity without evolution.  There is some admixed alpha as well.  This pattern is persistent throughout the study, even despite stimulation.  Photic stimulation: Physiologic driving is not performed  EEG Abnormalities: 1) diffuse beta activity  Clinical Interpretation: This EEG is consistent with beta coma, which can be seen in the setting of drug overdose.  There was no seizure or seizure predisposition recorded on this study. Please note that lack of epileptiform activity on EEG does not preclude the possibility of epilepsy.   Ritta Slot, MD [facility] [phone]  If 7pm- 7am, please page neurology on call as listed in AMION.

## 2023-06-05 NOTE — Progress Notes (Signed)
No need for LTM as per NEURO

## 2023-06-05 NOTE — Progress Notes (Signed)
Pt off floor.
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
## 2023-06-05 NOTE — Progress Notes (Addendum)
0701 admission questions can not be completed due to patient being intubated after found unresponsive 0715 patient on vent not responding to pain no sedation on at this time pressors on with IV watch bolus running 0750 patient turned to right side and skin assessment completed patient opened eyes able to squeeze hand on right once when asked but unable to repeat when requested pupils went from 2 while sleep to a 4 when eyes were opened. Patient didn't follow any other commands.  0900 patient waking up to sounds not following some commands still very lethargic room prepared for suicide patient all items removed per protocol. Patient will need sitter when extubated and or more awake. 1000 Items taken to security $30 cash, cell phone, black watch, patient paperwork for driving and coming into hospital after being found unresponsive bag ID # [id number]  Items bagged with patient sticker on them outside of door are brown boots grey socks and jeans with a brown belt  1320 patient resp rate remains 10 with vent settings unless he is being stimulated   1625 patient extubated to room air

## 2023-06-05 NOTE — Plan of Care (Signed)
Patient responds to pain at times unable to educate or progress with care plans at this time  Problem: Education: Goal: Knowledge of General Education information will improve Description: Including pain rating scale, medication(s)/side effects and non-pharmacologic comfort measures Outcome: Not Progressing   Problem: Health Behavior/Discharge Planning: Goal: Ability to manage health-related needs will improve Outcome: Not Progressing   Problem: Clinical Measurements: Goal: Ability to maintain clinical measurements within normal limits will improve Outcome: Not Progressing Goal: Will remain free from infection Outcome: Not Progressing Goal: Diagnostic test results will improve Outcome: Not Progressing Goal: Respiratory complications will improve Outcome: Not Progressing Goal: Cardiovascular complication will be avoided Outcome: Not Progressing   Problem: Activity: Goal: Risk for activity intolerance will decrease Outcome: Not Progressing   Problem: Nutrition: Goal: Adequate nutrition will be maintained Outcome: Not Progressing   Problem: Coping: Goal: Level of anxiety will decrease Outcome: Not Progressing   Problem: Elimination: Goal: Will not experience complications related to bowel motility Outcome: Not Progressing Goal: Will not experience complications related to urinary retention Outcome: Not Progressing   Problem: Pain Management: Goal: General experience of comfort will improve Outcome: Not Progressing   Problem: Safety: Goal: Ability to remain free from injury will improve Outcome: Not Progressing   Problem: Skin Integrity: Goal: Risk for impaired skin integrity will decrease Outcome: Not Progressing   Problem: Education: Goal: Knowledge of warning signs, risks, and behaviors that relate to suicide ideation and self-harm behaviors will improve Outcome: Not Progressing   Problem: Health Behavior/Discharge (Transition) Planning: Goal: Ability to manage  health-related needs will improve Outcome: Not Progressing   Problem: Clinical Measurements: Goal: Remain free from any harm during hospitalization Outcome: Not Progressing   Problem: Nutrition: Goal: Adequate fluids and nutrition will be maintained Outcome: Not Progressing   Problem: Coping: Goal: Ability to disclose and discuss thoughts of suicide and self-harm will improve Outcome: Not Progressing   Problem: Medication Management: Goal: Adhere to prescribed medication regimen Outcome: Not Progressing   Problem: Sleep Hygiene: Goal: Ability to obtain adequate restful sleep will improve Outcome: Not Progressing   Problem: Self Esteem: Goal: Ability to verbalize positive feeling about self will improve Outcome: Not Progressing   Problem: Education: Goal: Knowledge of disease or condition will improve Outcome: Not Progressing Goal: Understanding of discharge needs will improve Outcome: Not Progressing   Problem: Health Behavior/Discharge Planning: Goal: Ability to identify changes in lifestyle to reduce recurrence of condition will improve Outcome: Not Progressing Goal: Identification of resources available to assist in meeting health care needs will improve Outcome: Not Progressing   Problem: Physical Regulation: Goal: Complications related to the disease process, condition or treatment will be avoided or minimized Outcome: Not Progressing   Problem: Safety: Goal: Ability to remain free from injury will improve Outcome: Not Progressing   Problem: Safety: Goal: Ability to remain free from injury will improve Outcome: Not Progressing

## 2023-06-05 NOTE — Progress Notes (Signed)
Extubation Procedure Note  Patient Details:   Name: Ralph Fitzgerald DOB: 18-Jul-1985 MRN: [id number]   Airway Documentation:    Vent end date: 06/05/23 Vent end time: 1625   Evaluation  O2 sats: stable throughout Complications: No apparent complications Patient did tolerate procedure well. Bilateral Breath Sounds: Clear   Yes  Rutha Bouchard 06/05/2023, 5:28 PM

## 2023-06-05 NOTE — Progress Notes (Signed)
Medications that were checked into pharmacy at admission were retrieved per Wife's, Amy Lawhead, request as they were her medications.  Inside the sealed pharmacy bag was a plastic ziplock bag that was labeled as percocet 10/325 #12, carisoprodol 350mg  #13, hydrocodone 7.5mg /325mg  #6.  Those three medications with verified numbers were disposed of in the stericycle with Julienne Kass RN.  All medication bottles with Amy Burruel were returned to her.  She signed Home Medication form and the original copy was placed back in physical chart.

## 2023-06-05 NOTE — ED Notes (Signed)
[id number]
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
## 2023-06-06 ENCOUNTER — Inpatient Hospital Stay (HOSPITAL_COMMUNITY)
Admission: AD | Admit: 2023-06-06 | Discharge: 2023-06-11 | DRG: 885 | Disposition: A | Discharge status: Home / Self Care | Payer: Medicaid Other | Source: Intra-hospital | Attending: Psychiatry | Admitting: Psychiatry

## 2023-06-06 DIAGNOSIS — F419 Anxiety disorder, unspecified: Secondary | ICD-10-CM | POA: Diagnosis present

## 2023-06-06 DIAGNOSIS — M25519 Pain in unspecified shoulder: Secondary | ICD-10-CM | POA: Diagnosis present

## 2023-06-06 DIAGNOSIS — Z8249 Family history of ischemic heart disease and other diseases of the circulatory system: Secondary | ICD-10-CM

## 2023-06-06 DIAGNOSIS — Z813 Family history of other psychoactive substance abuse and dependence: Secondary | ICD-10-CM

## 2023-06-06 DIAGNOSIS — T40602A Poisoning by unspecified narcotics, intentional self-harm, initial encounter: Secondary | ICD-10-CM

## 2023-06-06 DIAGNOSIS — F1123 Opioid dependence with withdrawal: Secondary | ICD-10-CM | POA: Diagnosis present

## 2023-06-06 DIAGNOSIS — D649 Anemia, unspecified: Secondary | ICD-10-CM | POA: Diagnosis present

## 2023-06-06 DIAGNOSIS — T402X2D Poisoning by other opioids, intentional self-harm, subsequent encounter: Secondary | ICD-10-CM

## 2023-06-06 DIAGNOSIS — Z79899 Other long term (current) drug therapy: Secondary | ICD-10-CM | POA: Diagnosis not present

## 2023-06-06 DIAGNOSIS — F1320 Sedative, hypnotic or anxiolytic dependence, uncomplicated: Secondary | ICD-10-CM | POA: Diagnosis present

## 2023-06-06 DIAGNOSIS — G8929 Other chronic pain: Secondary | ICD-10-CM | POA: Diagnosis present

## 2023-06-06 DIAGNOSIS — F1020 Alcohol dependence, uncomplicated: Secondary | ICD-10-CM | POA: Diagnosis present

## 2023-06-06 DIAGNOSIS — H9319 Tinnitus, unspecified ear: Secondary | ICD-10-CM | POA: Diagnosis present

## 2023-06-06 DIAGNOSIS — G929 Unspecified toxic encephalopathy: Secondary | ICD-10-CM | POA: Diagnosis not present

## 2023-06-06 DIAGNOSIS — R45851 Suicidal ideations: Secondary | ICD-10-CM | POA: Diagnosis present

## 2023-06-06 DIAGNOSIS — T40601A Poisoning by unspecified narcotics, accidental (unintentional), initial encounter: Secondary | ICD-10-CM | POA: Diagnosis present

## 2023-06-06 DIAGNOSIS — F13239 Sedative, hypnotic or anxiolytic dependence with withdrawal, unspecified: Secondary | ICD-10-CM | POA: Diagnosis present

## 2023-06-06 DIAGNOSIS — J9601 Acute respiratory failure with hypoxia: Secondary | ICD-10-CM | POA: Diagnosis not present

## 2023-06-06 DIAGNOSIS — F332 Major depressive disorder, recurrent severe without psychotic features: Principal | ICD-10-CM

## 2023-06-06 DIAGNOSIS — F17290 Nicotine dependence, other tobacco product, uncomplicated: Secondary | ICD-10-CM | POA: Diagnosis present

## 2023-06-06 LAB — GLUCOSE, CAPILLARY
Glucose-Capillary: 92 mg/dL (ref 70–99)
Glucose-Capillary: 92 mg/dL (ref 70–99)

## 2023-06-06 MED ORDER — LACTATED RINGERS IV SOLN: INTRAVENOUS | Status: DC

## 2023-06-06 MED ORDER — ACETAMINOPHEN 325 MG PO TABS
650.0000 mg | ORAL_TABLET | Freq: Four times a day (QID) | ORAL | Status: DC | PRN
  Administered 2023-06-07: 650 mg via ORAL
  Filled 2023-06-06: qty 2

## 2023-06-06 MED ORDER — MAGNESIUM HYDROXIDE 400 MG/5ML PO SUSP: 30.0000 mL | Freq: Every day | ORAL | Status: DC | PRN

## 2023-06-06 MED ORDER — ALUM & MAG HYDROXIDE-SIMETH 200-200-20 MG/5ML PO SUSP
30.0000 mL | ORAL | Status: DC | PRN
Start: 2023-06-06 — End: 2023-06-11
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
## 2023-06-06 NOTE — Plan of Care (Signed)
  Problem: Clinical Measurements: Goal: Ability to maintain clinical measurements within normal limits will improve Outcome: Progressing Goal: Will remain free from infection Outcome: Progressing Goal: Diagnostic test results will improve Outcome: Progressing Goal: Respiratory complications will improve Outcome: Progressing Goal: Cardiovascular complication will be avoided Outcome: Progressing   Problem: Elimination: Goal: Will not experience complications related to urinary retention Outcome: Progressing   Problem: Pain Management: Goal: General experience of comfort will improve Outcome: Progressing   Problem: Safety: Goal: Ability to remain free from injury will improve Outcome: Progressing   Problem: Clinical Measurements: Goal: Remain free from any harm during hospitalization Outcome: Progressing   Problem: Nutrition: Goal: Adequate fluids and nutrition will be maintained Outcome: Progressing   Problem: Coping: Goal: Ability to disclose and discuss thoughts of suicide and self-harm will improve Outcome: Progressing   Problem: Sleep Hygiene: Goal: Ability to obtain adequate restful sleep will improve Outcome: Progressing

## 2023-06-06 NOTE — Progress Notes (Signed)
Pt being escorted by [employer] to [facility] with pt belongings within police possession. [facility] made aware of transport with [employer] en route to their facility.

## 2023-06-06 NOTE — Plan of Care (Signed)
  Problem: Education: Goal: Knowledge of General Education information will improve Description: Including pain rating scale, medication(s)/side effects and non-pharmacologic comfort measures Outcome: Progressing   Problem: Health Behavior/Discharge Planning: Goal: Ability to manage health-related needs will improve Outcome: Progressing   Problem: Clinical Measurements: Goal: Ability to maintain clinical measurements within normal limits will improve Outcome: Progressing Goal: Will remain free from infection Outcome: Progressing Goal: Diagnostic test results will improve Outcome: Progressing Goal: Respiratory complications will improve Outcome: Progressing Goal: Cardiovascular complication will be avoided Outcome: Progressing   Problem: Activity: Goal: Risk for activity intolerance will decrease Outcome: Progressing   Problem: Nutrition: Goal: Adequate nutrition will be maintained Outcome: Progressing   Problem: Coping: Goal: Level of anxiety will decrease Outcome: Not Progressing Note: Patient still tearful alot   Problem: Elimination: Goal: Will not experience complications related to bowel motility Outcome: Progressing Goal: Will not experience complications related to urinary retention Outcome: Progressing   Problem: Pain Management: Goal: General experience of comfort will improve Outcome: Progressing   Problem: Safety: Goal: Ability to remain free from injury will improve Outcome: Progressing   Problem: Skin Integrity: Goal: Risk for impaired skin integrity will decrease Outcome: Progressing   Problem: Education: Goal: Knowledge of warning signs, risks, and behaviors that relate to suicide ideation and self-harm behaviors will improve Outcome: Progressing   Problem: Health Behavior/Discharge (Transition) Planning: Goal: Ability to manage health-related needs will improve Outcome: Progressing   Problem: Clinical Measurements: Goal: Remain free from  any harm during hospitalization Outcome: Progressing   Problem: Nutrition: Goal: Adequate fluids and nutrition will be maintained Outcome: Progressing   Problem: Coping: Goal: Ability to disclose and discuss thoughts of suicide and self-harm will improve Outcome: Progressing   Problem: Medication Management: Goal: Adhere to prescribed medication regimen Outcome: Progressing   Problem: Sleep Hygiene: Goal: Ability to obtain adequate restful sleep will improve Outcome: Progressing   Problem: Self Esteem: Goal: Ability to verbalize positive feeling about self will improve Outcome: Progressing Note: Pt can not verbalize that his life is worth living for   Problem: Education: Goal: Knowledge of disease or condition will improve Outcome: Progressing Goal: Understanding of discharge needs will improve Outcome: Progressing   Problem: Health Behavior/Discharge Planning: Goal: Ability to identify changes in lifestyle to reduce recurrence of condition will improve Outcome: Progressing Goal: Identification of resources available to assist in meeting health care needs will improve Outcome: Progressing   Problem: Physical Regulation: Goal: Complications related to the disease process, condition or treatment will be avoided or minimized Outcome: Progressing   Problem: Safety: Goal: Ability to remain free from injury will improve Outcome: Progressing

## 2023-06-06 NOTE — Consult Note (Signed)
[facility] Face-to-Face Psychiatry Consult   Reason for Consult:  suicide attempt Referring Physician:  Dr. Delton Coombes Patient Identification: Ralph Fitzgerald MRN:  [id number] Principal Diagnosis: Opiate overdose [facility] Diagnosis:  Principal Problem:   Opiate overdose (HCC)   Total Time spent with patient: 1 hour  Subjective:   Ralph Fitzgerald is a 37 y.o. male patient admitted with suicide attempt.  HPI:  Patient was interviewed with father at bedside chart reviewed.  Patient was admitted to the hospital after a suicide attempt by overdosing on a lot of pills.  Patient was found unresponsive in the car.  Patient was intubated and extubated earlier today..  Patient is urine drug screen was positive for opiates and benzos.  Patient reports the stressor was marital problems.  The patient reports poor sleep, and his appetite is fair.  Patient still wishes that his suicide attempt had worked because that was his plan.  He wished he was not found when he was.  Patient continues to have suicidal ideation and wishes he were dead.  Patient denies any homicidal ideation or auditory or visual hallucinations.  Past Psychiatric History: Patient has had at least 2 suicide attempts in the past and was admitted to aliments in the past as well as" [facility].  Patient reports his primary care physician has prescribed Lexapro 20 mg daily for a long time.  Chart reviewed notes that patient is also prescribed Seroquel 200 mg on an outpatient basis.  Risk to Self:  Yes recent overdose on pills. Risk to Others:  None reported Prior Inpatient Therapy:  Yes at [facility] and will be near Prior Outpatient Therapy:  Patient reports his primary care physician is currently prescribing the Lexapro.  Past Medical History:  Past Medical History:  Diagnosis Date   Anxiety    Insomnia    Opioid use disorder     Past Surgical History:  Procedure Laterality Date   IRRIGATION AND DEBRIDEMENT FOOT Left 10/20/2019   Procedure: IRRIGATION  AND DEBRIDEMENT FOOT;  Surgeon: Linus Galas, DPM;  Location: [facility] ORS;  Service: Podiatry;  Laterality: Left;   Family History:  Family History  Problem Relation Age of Onset   Drug abuse Father    Family Psychiatric  History: Unremarkable Social History:  Social History   Substance and Sexual Activity  Alcohol Use Yes   Comment: occasional     Social History   Substance and Sexual Activity  Drug Use Yes   Types: Cocaine, Marijuana    Social History   Socioeconomic History   Marital status: Married    Spouse name: Not on file   Number of children: Not on file   Years of education: Not on file   Highest education level: Not on file  Occupational History   Not on file  Tobacco Use   Smoking status: Never   Smokeless tobacco: Never  Vaping Use   Vaping status: Some Days   Substances: Flavoring  Substance and Sexual Activity   Alcohol use: Yes    Comment: occasional   Drug use: Yes    Types: Cocaine, Marijuana   Sexual activity: Not Currently    Comment: not asked if sexually active  Other Topics Concern   Not on file  Social History Narrative   Not on file   Social Determinants of Health   Financial Resource Strain: Low Risk  (08/17/2022)   [facility], [facility]   Overall Financial Resource Strain (CARDIA)    Difficulty of  Paying Living Expenses: Not very hard  Food Insecurity: No Food Insecurity (06/05/2023)   Hunger Vital Sign    Worried About Running Out of Food in the Last Year: Never true    Ran Out of Food in the Last Year: Never true  Transportation Needs: No Transportation Needs (06/05/2023)   PRAPARE - Administrator, Civil Service (Medical): No    Lack of Transportation (Non-Medical): No  Physical Activity: Not on file  Stress: Not on file  Social Connections: Not on file   Additional Social History:    Allergies:   Allergies  Allergen Reactions   Meloxicam Hives, Itching and Rash   Penicillins Hives    Diclofenac Rash    Labs:  Results for orders placed or performed during the hospital encounter of 06/05/23 (from the past 48 hour(s))  CBG monitoring, ED     Status: None   Collection Time: 06/05/23  1:08 AM  Result Value Ref Range   Glucose-Capillary 84 70 - 99 mg/dL    Comment: Glucose reference range applies only to samples taken after fasting for at least 8 hours.  Acetaminophen level     Status: None   Collection Time: 06/05/23  1:08 AM  Result Value Ref Range   Acetaminophen (Tylenol), Serum 18 10 - 30 ug/mL    Comment: (NOTE) Therapeutic concentrations vary significantly. A range of 10-30 ug/mL  may be an effective concentration for many patients. However, some  are best treated at concentrations outside of this range. Acetaminophen concentrations >150 ug/mL at 4 hours after ingestion  and >50 ug/mL at 12 hours after ingestion are often associated with  toxic reactions.  Performed at [facility], [address] [place], [place] [address]   Ethanol     Status: Abnormal   Collection Time: 06/05/23  1:08 AM  Result Value Ref Range   Alcohol, Ethyl (B) 165 (H) <10 mg/dL    Comment: (NOTE) Lowest detectable limit for serum alcohol is 10 mg/dL.  For medical purposes only. Performed at [facility], [address], [place], [place] [address]   Salicylate level     Status: Abnormal   Collection Time: 06/05/23  1:08 AM  Result Value Ref Range   Salicylate Lvl <7.0 (L) 7.0 - 30.0 mg/dL    Comment: Performed at [facility], [address] [place], [place] [address]  Troponin I (High Sensitivity)     Status: None   Collection Time: 06/05/23  1:08 AM  Result Value Ref Range   Troponin I (High Sensitivity) 5 <18 ng/L    Comment: (NOTE) Elevated high sensitivity troponin I (hsTnI) values and significant  changes across serial measurements may suggest ACS but many other  chronic and acute conditions are known to elevate hsTnI results.  Refer to the  "Links" section for chest pain algorithms and additional  guidance. Performed at [facility], [address] [place], [place] [address]   TSH     Status: None   Collection Time: 06/05/23  1:08 AM  Result Value Ref Range   TSH 1.525 0.350 - 4.500 uIU/mL    Comment: Performed by a 3rd Generation assay with a functional sensitivity of <=0.01 uIU/mL. Performed at [facility], [address] [place], [place] [address]   HIV Antibody (routine testing w rflx)     Status: None   Collection Time: 06/05/23  1:08 AM  Result Value Ref Range   HIV  Screen 4th Generation wRfx Non Reactive Non Reactive    Comment: Performed at [facility], [address], [place], [place] [address]  Urine rapid drug screen (hosp performed)     Status: Abnormal   Collection Time: 06/05/23  2:25 AM  Result Value Ref Range   Opiates POSITIVE (A) NONE DETECTED   Cocaine NONE DETECTED NONE DETECTED   Benzodiazepines POSITIVE (A) NONE DETECTED   Amphetamines NONE DETECTED NONE DETECTED   Tetrahydrocannabinol NONE DETECTED NONE DETECTED   Barbiturates NONE DETECTED NONE DETECTED    Comment: (NOTE) DRUG SCREEN FOR MEDICAL PURPOSES ONLY.  IF CONFIRMATION IS NEEDED FOR ANY PURPOSE, NOTIFY LAB WITHIN 5 DAYS.  LOWEST DETECTABLE LIMITS FOR URINE DRUG SCREEN Drug Class                     Cutoff (ng/mL) Amphetamine and metabolites    1000 Barbiturate and metabolites    200 Benzodiazepine                 200 Opiates and metabolites        300 Cocaine and metabolites        300 THC                            50 Performed at [facility], [address] [place], [place] [address]   .Cooxemetry Panel (carboxy, met, total hgb, O2 sat)(NOT AT [facility] or [facility])     Status: Abnormal   Collection Time: 06/05/23  2:27 AM  Result Value Ref Range   Total hemoglobin 11.5 (L) 12.0 - 16.0 g/dL   O2 Saturation 36.6 %   Carboxyhemoglobin 2.9 (H) 0.5 - 1.5 %   Methemoglobin <0.7 0.0 - 1.5 %    Comment:  Performed at [facility], [address] [place], [place] [address]  CBC     Status: Abnormal   Collection Time: 06/05/23  3:00 AM  Result Value Ref Range   WBC 3.5 (L) 4.0 - 10.5 K/uL   RBC 3.44 (L) 4.22 - 5.81 MIL/uL   Hemoglobin 10.7 (L) 13.0 - 17.0 g/dL   HCT 74.2 (L) 59.5 - 63.8 %   MCV 94.8 80.0 - 100.0 fL   MCH 31.1 26.0 - 34.0 pg   MCHC 32.8 30.0 - 36.0 g/dL   RDW 75.6 43.3 - 29.5 %   Platelets 158 150 - 400 K/uL   nRBC 0.0 0.0 - 0.2 %    Comment: Performed at [facility], [address], [place], [place] [address]  Creatinine, serum     Status: None   Collection Time: 06/05/23  3:00 AM  Result Value Ref Range   Creatinine, Ser 0.86 0.61 - 1.24 mg/dL   GFR, Estimated >66 >06 mL/min    Comment: (NOTE) Calculated using the CKD-EPI Creatinine Equation (2021) Performed at [facility], [address], [place], [place] [address]   Basic metabolic panel     Status: Abnormal   Collection Time: 06/05/23  3:00 AM  Result Value Ref Range   Sodium 144 135 - 145 mmol/L   Potassium 3.5 3.5 - 5.1 mmol/L   Chloride 110 98 - 111 mmol/L   CO2 23 22 - 32 mmol/L   Glucose, Bld 86 70 - 99 mg/dL    Comment: Glucose reference range applies only to samples taken after fasting for at least 8 hours.  BUN 13 6 - 20 mg/dL   Creatinine, Ser 1.61 0.61 - 1.24 mg/dL   Calcium 7.9 (L) 8.9 - 10.3 mg/dL   GFR, Estimated >09 >60 mL/min    Comment: (NOTE) Calculated using the CKD-EPI Creatinine Equation (2021)    Anion gap 11 5 - 15    Comment: Performed at [facility], [address], [place], [place] [address]  Troponin I (High Sensitivity)     Status: None   Collection Time: 06/05/23  3:00 AM  Result Value Ref Range   Troponin I (High Sensitivity) 5 <18 ng/L    Comment: (NOTE) Elevated high sensitivity troponin I (hsTnI) values and significant  changes across serial measurements may suggest ACS but many other  chronic and acute conditions are known to elevate  hsTnI results.  Refer to the "Links" section for chest pain algorithms and additional  guidance. Performed at [facility], [address] [place], [place] [address]   MRSA Next Gen by PCR, Nasal     Status: None   Collection Time: 06/05/23  3:06 AM   Specimen: Nasal Mucosa; Nasal Swab  Result Value Ref Range   MRSA by PCR Next Gen NOT DETECTED NOT DETECTED    Comment: (NOTE) The GeneXpert MRSA Assay (FDA approved for NASAL specimens only), is one component of a comprehensive MRSA colonization surveillance program. It is not intended to diagnose MRSA infection nor to guide or monitor treatment for MRSA infections. Test performance is not FDA approved in patients less than 93 years old. Performed at [facility], [address], [place], [place] [address]   I-Stat arterial blood gas, ED     Status: Abnormal   Collection Time: 06/05/23  3:09 AM  Result Value Ref Range   pH, Arterial 7.375 7.35 - 7.45   pCO2 arterial 46.1 32 - 48 mmHg   pO2, Arterial 413 (H) 83 - 108 mmHg   Bicarbonate 27.6 20.0 - 28.0 mmol/L   TCO2 29 22 - 32 mmol/L   O2 Saturation 100 %   Acid-Base Excess 1.0 0.0 - 2.0 mmol/L   Sodium 145 135 - 145 mmol/L   Potassium 3.5 3.5 - 5.1 mmol/L   Calcium, Ion 1.15 1.15 - 1.40 mmol/L   HCT 30.0 (L) 39.0 - 52.0 %   Hemoglobin 10.2 (L) 13.0 - 17.0 g/dL   Patient temperature 56.2 F    Collection site RADIAL, ALLEN'S TEST ACCEPTABLE    Drawn by Operator    Sample type ARTERIAL   Glucose, capillary     Status: None   Collection Time: 06/05/23  3:33 AM  Result Value Ref Range   Glucose-Capillary 88 70 - 99 mg/dL    Comment: Glucose reference range applies only to samples taken after fasting for at least 8 hours.  Magnesium     Status: None   Collection Time: 06/05/23  3:52 AM  Result Value Ref Range   Magnesium 2.3 1.7 - 2.4 mg/dL    Comment: HEMOLYSIS AT THIS LEVEL MAY AFFECT RESULT Performed at [facility], [address] [place], [place]  [address]   Glucose, capillary     Status: None   Collection Time: 06/05/23  7:32 AM  Result Value Ref Range   Glucose-Capillary 92 70 - 99 mg/dL    Comment: Glucose reference range applies only to samples taken after fasting for at least 8 hours.  Glucose, capillary     Status: None   Collection Time:  06/05/23 12:16 PM  Result Value Ref Range   Glucose-Capillary 77 70 - 99 mg/dL    Comment: Glucose reference range applies only to samples taken after fasting for at least 8 hours.  Glucose, capillary     Status: Abnormal   Collection Time: 06/05/23  7:29 PM  Result Value Ref Range   Glucose-Capillary 66 (L) 70 - 99 mg/dL    Comment: Glucose reference range applies only to samples taken after fasting for at least 8 hours.  Glucose, capillary     Status: Abnormal   Collection Time: 06/05/23  7:59 PM  Result Value Ref Range   Glucose-Capillary 121 (H) 70 - 99 mg/dL    Comment: Glucose reference range applies only to samples taken after fasting for at least 8 hours.  Glucose, capillary     Status: None   Collection Time: 06/05/23 11:18 PM  Result Value Ref Range   Glucose-Capillary 87 70 - 99 mg/dL    Comment: Glucose reference range applies only to samples taken after fasting for at least 8 hours.  Glucose, capillary     Status: None   Collection Time: 06/06/23  3:11 AM  Result Value Ref Range   Glucose-Capillary 92 70 - 99 mg/dL    Comment: Glucose reference range applies only to samples taken after fasting for at least 8 hours.  Glucose, capillary     Status: None   Collection Time: 06/06/23  7:42 AM  Result Value Ref Range   Glucose-Capillary 92 70 - 99 mg/dL    Comment: Glucose reference range applies only to samples taken after fasting for at least 8 hours.    Current Facility-Administered Medications  Medication Dose Route Frequency Provider Last Rate Last Admin   acetaminophen (TYLENOL) tablet 650 mg  650 mg Oral Q6H PRN Henry Russel, MD   650 mg at 06/06/23 0202    Chlorhexidine Gluconate Cloth 2 % PADS 6 each  6 each Topical Daily Lynnell Catalan, MD   6 each at 06/06/23 1014   enoxaparin (LOVENOX) injection 40 mg  40 mg Subcutaneous Q24H Lynnell Catalan, MD   40 mg at 06/06/23 7425   lactated ringers infusion   Intravenous Continuous Henry Russel, MD 40 mL/hr at 06/06/23 0600 Infusion Verify at 06/06/23 0600   Oral care mouth rinse  15 mL Mouth Rinse PRN Lynnell Catalan, MD        Musculoskeletal: Strength & Muscle Tone: within normal limits Gait & Station: normal Patient leans: N/A            Psychiatric Specialty Exam:  Presentation  General Appearance:  Casual; Fairly Groomed  Eye Contact: Fair  Speech: Clear and Coherent; Normal Rate  Speech Volume: Normal  Handedness: Right   Mood and Affect  Mood: Depressed; Irritable  Affect: Labile; Constricted   Thought Process  Thought Processes: Coherent; Goal Directed; Linear  Descriptions of Associations:Intact  Orientation:Full (Time, Place and Person)  Thought Content:Logical  History of Schizophrenia/Schizoaffective disorder:No data recorded Duration of Psychotic Symptoms:No data recorded Hallucinations:Hallucinations: None  Ideas of Reference:None  Suicidal Thoughts:Suicidal Thoughts: Yes, Active SI Active Intent and/or Plan: Without Means to Carry Out; Without Access to Means  Homicidal Thoughts:Homicidal Thoughts: No   Sensorium  Memory: Immediate Fair; Recent Fair; Remote Fair  Judgment: Poor  Insight: Poor   Executive Functions  Concentration: Poor  Attention Span: Poor  Recall: [employer] of Knowledge: Fair  Language: Fair   Psychomotor Activity  Psychomotor Activity: Psychomotor Activity: Normal  Assets  Assets: Social Support   Sleep  Sleep: Sleep: Poor   Physical Exam: Physical Exam ROS Blood pressure 119/75, pulse 73, temperature 98.4 F (36.9 C), temperature source Oral, resp. rate (!) 8, height 5\' 10"   (1.778 m), weight 77 kg, SpO2 95%. Body mass index is 24.36 kg/m.  Treatment Plan Summary: Given the severity of patient's recent suicide attempt and requiring intubation patient will need to be admitted to a psychiatric facility for continued observation.  Patient continues to wish she were not alive and that his suicide attempt had worked.  Father who was at bedside was in agreement that patient needs to be admitted to a psychiatric facility.  Disposition: Recommend psychiatric Inpatient admission when medically cleared. Discussed crisis plan, support from social network, calling 911, coming to the Emergency Department, and calling Suicide Hotline.  Ancil Linsey, MD 06/06/2023 12:43 PM

## 2023-06-06 NOTE — Evaluation (Signed)
Occupational Therapy Evaluation and Discharge Patient Details Name: Ralph Fitzgerald MRN: [id number] DOB: Jun 13, 1986 Today's Date: 06/06/2023   History of Present Illness Pt is a 37 year old man admitted on 11/23 with unresponsive in his vehicle. + benzos, cocaine, TCA and opiates. Per family, pt had suicidal ideation earlier in the day. PMH: chronic pain, anxiety, mood disorer, opiate disordre, h/o TCA OD.   Clinical Impression   Pt is functioning independently in ADLs and ADL transfers. Does not recall incident for which he was admitted, but cognition otherwise appears intact. No further OT needs.       If plan is discharge home, recommend the following:      Functional Status Assessment  Patient has not had a recent decline in their functional status  Equipment Recommendations  None recommended by OT    Recommendations for Other Services       Precautions / Restrictions Precautions Precautions: Other (comment) (suicide) Restrictions Weight Bearing Restrictions: No      Mobility Bed Mobility Overal bed mobility: Independent                  Transfers Overall transfer level: Independent                        Balance                                           ADL either performed or assessed with clinical judgement   ADL Overall ADL's : Independent                                             Vision Baseline Vision/History: 0 No visual deficits       Perception         Praxis         Pertinent Vitals/Pain Pain Assessment Pain Assessment: No/denies pain     Extremity/Trunk Assessment Upper Extremity Assessment Upper Extremity Assessment: Overall WFL for tasks assessed   Lower Extremity Assessment Lower Extremity Assessment: Overall WFL for tasks assessed   Cervical / Trunk Assessment Cervical / Trunk Assessment: Normal   Communication Communication Communication: No apparent difficulties    Cognition Arousal: Alert Behavior During Therapy: WFL for tasks assessed/performed Overall Cognitive Status: Within Functional Limits for tasks assessed                                       General Comments       Exercises     Shoulder Instructions      Home Living Family/patient expects to be discharged to:: Private residence Living Arrangements: Spouse/significant other;Children Available Help at Discharge: Family;Available PRN/intermittently Type of Home: House Home Access: Level entry     Home Layout: One level     Bathroom Shower/Tub: Chief Strategy Officer: Standard     Home Equipment: Crutches          Prior Functioning/Environment Prior Level of Function : Independent/Modified Independent;Working/employed                        OT Problem List:  OT Treatment/Interventions:      OT Goals(Current goals can be found in the care plan section)    OT Frequency:      Co-evaluation              AM-PAC OT "6 Clicks" Daily Activity     Outcome Measure Help from another person eating meals?: None Help from another person taking care of personal grooming?: None Help from another person toileting, which includes using toliet, bedpan, or urinal?: None Help from another person bathing (including washing, rinsing, drying)?: None Help from another person to put on and taking off regular upper body clothing?: None Help from another person to put on and taking off regular lower body clothing?: None 6 Click Score: 24   End of Session    Activity Tolerance: Patient tolerated treatment well Patient left: in bed;with call bell/phone within reach;with nursing/sitter in room  OT Visit Diagnosis: Muscle weakness (generalized) (M62.81)                Time: 3664-4034 OT Time Calculation (min): 17 min Charges:  OT General Charges $OT Visit: 1 Visit OT Evaluation $OT Eval Low Complexity: 1 Low  Berna Spare, OTR/L Acute  Rehabilitation Services Office: [phone]   Evern Bio 06/06/2023, 9:13 AM
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
## 2023-06-06 NOTE — ED Notes (Signed)
IVC COMPLETE 3 COPIES GIVEN TO 50M ORIGINAL IN RED FOLDER IN THE ED 1 COPY IN MEDICAL RECORDS ORANGE ZONE ED EXP 11/31/24 CASE #[id number]

## 2023-06-06 NOTE — Progress Notes (Addendum)
1728 IVC paper work and order obtained faxed to [facility]. Called for transportation. Belongings obtained from security 1800 patient changed into disposable pant and top with flip flops from ED after eating dinner. Discharged completed waiting on transport at this time  1820 called transport back no officer available at this time updated [facility] and updated them that patient will be coming when transport available.

## 2023-06-06 NOTE — Progress Notes (Signed)
PT Cancellation Note  Patient Details Name: ZOLAN UTTLEY MRN: [id number] DOB: 1985/08/26   Cancelled Treatment:    Reason Eval/Treat Not Completed: PT screened, no needs identified, will sign off. Per discussion with OT, pt is completely independent and back to baseline. No acute PT needs, please feel free to re-consult if change in status.   Vickki Muff, PT, DPT   Acute Rehabilitation Department Office [phone] Secure Chat Communication Preferred    Ronnie Derby 06/06/2023, 11:15 AM

## 2023-06-06 NOTE — Progress Notes (Signed)
SLP Cancellation Note  Patient Details Name: [name] MRN: [id number] DOB: 1985-11-23   Cancelled treatment:        Per PA, patient returning to baseline, no need for cognitive-linguistic eval. Please re-consult if needed.   Ferdinand Lango MA, CCC-SLP    Cranston Koors Meryl 06/06/2023, 8:35 AM

## 2023-06-06 NOTE — Plan of Care (Signed)
Problem: Education: Goal: Knowledge of General Education information will improve Description: Including pain rating scale, medication(s)/side effects and non-pharmacologic comfort measures 06/06/2023 1821 by Aretta Nip, RN Outcome: Adequate for Discharge 06/06/2023 1048 by Aretta Nip, RN Outcome: Progressing   Problem: Health Behavior/Discharge Planning: Goal: Ability to manage health-related needs will improve 06/06/2023 1821 by Aretta Nip, RN Outcome: Adequate for Discharge 06/06/2023 1048 by Aretta Nip, RN Outcome: Progressing   Problem: Clinical Measurements: Goal: Ability to maintain clinical measurements within normal limits will improve 06/06/2023 1821 by Aretta Nip, RN Outcome: Adequate for Discharge 06/06/2023 1048 by Aretta Nip, RN Outcome: Progressing Goal: Will remain free from infection 06/06/2023 1821 by Aretta Nip, RN Outcome: Adequate for Discharge 06/06/2023 1048 by Aretta Nip, RN Outcome: Progressing Goal: Diagnostic test results will improve 06/06/2023 1821 by Aretta Nip, RN Outcome: Adequate for Discharge 06/06/2023 1048 by Aretta Nip, RN Outcome: Progressing Goal: Respiratory complications will improve 06/06/2023 1821 by Aretta Nip, RN Outcome: Adequate for Discharge 06/06/2023 1048 by Aretta Nip, RN Outcome: Progressing Goal: Cardiovascular complication will be avoided 06/06/2023 1821 by Aretta Nip, RN Outcome: Adequate for Discharge 06/06/2023 1048 by Aretta Nip, RN Outcome: Progressing   Problem: Activity: Goal: Risk for activity intolerance will decrease 06/06/2023 1821 by Aretta Nip, RN Outcome: Adequate for Discharge 06/06/2023 1048 by Aretta Nip, RN Outcome: Progressing   Problem: Nutrition: Goal: Adequate nutrition will be maintained 06/06/2023 1821 by Aretta Nip, RN Outcome: Adequate for Discharge 06/06/2023 1048 by Aretta Nip, RN Outcome:  Progressing   Problem: Coping: Goal: Level of anxiety will decrease 06/06/2023 1821 by Aretta Nip, RN Outcome: Adequate for Discharge 06/06/2023 1048 by Aretta Nip, RN Outcome: Not Progressing Note: Patient still tearful alot   Problem: Elimination: Goal: Will not experience complications related to bowel motility 06/06/2023 1821 by Aretta Nip, RN Outcome: Adequate for Discharge 06/06/2023 1048 by Aretta Nip, RN Outcome: Progressing Goal: Will not experience complications related to urinary retention 06/06/2023 1821 by Aretta Nip, RN Outcome: Adequate for Discharge 06/06/2023 1048 by Aretta Nip, RN Outcome: Progressing   Problem: Pain Management: Goal: General experience of comfort will improve 06/06/2023 1821 by Aretta Nip, RN Outcome: Adequate for Discharge 06/06/2023 1048 by Aretta Nip, RN Outcome: Progressing   Problem: Safety: Goal: Ability to remain free from injury will improve 06/06/2023 1821 by Aretta Nip, RN Outcome: Adequate for Discharge 06/06/2023 1048 by Aretta Nip, RN Outcome: Progressing   Problem: Skin Integrity: Goal: Risk for impaired skin integrity will decrease 06/06/2023 1821 by Aretta Nip, RN Outcome: Adequate for Discharge 06/06/2023 1048 by Aretta Nip, RN Outcome: Progressing   Problem: Education: Goal: Knowledge of warning signs, risks, and behaviors that relate to suicide ideation and self-harm behaviors will improve 06/06/2023 1821 by Aretta Nip, RN Outcome: Adequate for Discharge 06/06/2023 1048 by Aretta Nip, RN Outcome: Progressing   Problem: Health Behavior/Discharge (Transition) Planning: Goal: Ability to manage health-related needs will improve 06/06/2023 1821 by Aretta Nip, RN Outcome: Adequate for Discharge 06/06/2023 1048 by Aretta Nip, RN Outcome: Progressing   Problem: Clinical Measurements: Goal: Remain free from any harm during  hospitalization 06/06/2023 1821 by Aretta Nip, RN Outcome: Adequate for Discharge 06/06/2023 1048 by Aretta Nip, RN Outcome: Progressing   Problem: Nutrition: Goal: Adequate fluids and nutrition will be maintained 06/06/2023 1821 by Julienne Kass  R, RN Outcome: Adequate for Discharge 06/06/2023 1048 by Aretta Nip, RN Outcome: Progressing   Problem: Coping: Goal: Ability to disclose and discuss thoughts of suicide and self-harm will improve 06/06/2023 1821 by Aretta Nip, RN Outcome: Adequate for Discharge 06/06/2023 1048 by Aretta Nip, RN Outcome: Progressing   Problem: Medication Management: Goal: Adhere to prescribed medication regimen 06/06/2023 1821 by Aretta Nip, RN Outcome: Adequate for Discharge 06/06/2023 1048 by Aretta Nip, RN Outcome: Progressing   Problem: Sleep Hygiene: Goal: Ability to obtain adequate restful sleep will improve 06/06/2023 1821 by Aretta Nip, RN Outcome: Adequate for Discharge 06/06/2023 1048 by Aretta Nip, RN Outcome: Progressing   Problem: Self Esteem: Goal: Ability to verbalize positive feeling about self will improve 06/06/2023 1821 by Aretta Nip, RN Outcome: Adequate for Discharge 06/06/2023 1048 by Aretta Nip, RN Outcome: Progressing Note: Pt can not verbalize that his life is worth living for   Problem: Education: Goal: Knowledge of disease or condition will improve 06/06/2023 1821 by Aretta Nip, RN Outcome: Adequate for Discharge 06/06/2023 1048 by Aretta Nip, RN Outcome: Progressing Goal: Understanding of discharge needs will improve 06/06/2023 1821 by Aretta Nip, RN Outcome: Adequate for Discharge 06/06/2023 1048 by Aretta Nip, RN Outcome: Progressing   Problem: Health Behavior/Discharge Planning: Goal: Ability to identify changes in lifestyle to reduce recurrence of condition will improve 06/06/2023 1821 by Aretta Nip, RN Outcome: Adequate for  Discharge 06/06/2023 1048 by Aretta Nip, RN Outcome: Progressing Goal: Identification of resources available to assist in meeting health care needs will improve 06/06/2023 1821 by Aretta Nip, RN Outcome: Adequate for Discharge 06/06/2023 1048 by Aretta Nip, RN Outcome: Progressing   Problem: Physical Regulation: Goal: Complications related to the disease process, condition or treatment will be avoided or minimized 06/06/2023 1821 by Aretta Nip, RN Outcome: Adequate for Discharge 06/06/2023 1048 by Aretta Nip, RN Outcome: Progressing   Problem: Safety: Goal: Ability to remain free from injury will improve 06/06/2023 1821 by Aretta Nip, RN Outcome: Adequate for Discharge 06/06/2023 1048 by Aretta Nip, RN Outcome: Progressing

## 2023-06-07 ENCOUNTER — Encounter (HOSPITAL_COMMUNITY): Payer: Self-pay

## 2023-06-07 ENCOUNTER — Other Ambulatory Visit: Payer: Self-pay

## 2023-06-07 ENCOUNTER — Encounter (HOSPITAL_COMMUNITY): Payer: Self-pay | Admitting: Psychiatry

## 2023-06-07 MED ORDER — HYDROXYZINE HCL 25 MG PO TABS
25.0000 mg | ORAL_TABLET | Freq: Four times a day (QID) | ORAL | Status: DC | PRN
  Administered 2023-06-07 – 2023-06-09 (×2): 25 mg via ORAL
  Filled 2023-06-07 (×2): qty 1

## 2023-06-07 MED ORDER — NICOTINE 21 MG/24HR TD PT24
21.0000 mg | MEDICATED_PATCH | Freq: Every day | TRANSDERMAL | Status: DC
  Filled 2023-06-07 (×5): qty 1

## 2023-06-07 MED ORDER — PAROXETINE HCL 20 MG PO TABS
20.0000 mg | ORAL_TABLET | Freq: Every day | ORAL | Status: DC
  Administered 2023-06-07 – 2023-06-11 (×5): 20 mg via ORAL
  Filled 2023-06-07 (×7): qty 1

## 2023-06-07 MED ORDER — QUETIAPINE FUMARATE 200 MG PO TABS
200.0000 mg | ORAL_TABLET | Freq: Every day | ORAL | Status: DC
  Administered 2023-06-07 – 2023-06-10 (×4): 200 mg via ORAL
  Filled 2023-06-07 (×5): qty 1

## 2023-06-07 MED ORDER — METHOCARBAMOL 500 MG PO TABS
500.0000 mg | ORAL_TABLET | Freq: Three times a day (TID) | ORAL | Status: DC | PRN
  Administered 2023-06-07: 500 mg via ORAL
  Filled 2023-06-07: qty 1

## 2023-06-07 MED ORDER — ENSURE ENLIVE PO LIQD
237.0000 mL | Freq: Two times a day (BID) | ORAL | Status: DC
  Administered 2023-06-07 – 2023-06-10 (×6): 237 mL via ORAL
  Filled 2023-06-07 (×11): qty 237

## 2023-06-07 MED ORDER — LORAZEPAM 1 MG PO TABS: 1.0000 mg | ORAL_TABLET | Freq: Four times a day (QID) | ORAL | Status: DC | PRN

## 2023-06-07 MED ORDER — ONDANSETRON 4 MG PO TBDP: 4.0000 mg | ORAL_TABLET | Freq: Four times a day (QID) | ORAL | Status: DC | PRN

## 2023-06-07 MED ORDER — LOPERAMIDE HCL 2 MG PO CAPS: 2.0000 mg | ORAL_CAPSULE | ORAL | Status: DC | PRN

## 2023-06-07 MED ORDER — CLONIDINE HCL 0.1 MG PO TABS
0.1000 mg | ORAL_TABLET | Freq: Four times a day (QID) | ORAL | Status: AC
Start: 2023-06-07 — End: 2023-06-09
  Administered 2023-06-07 – 2023-06-09 (×8): 0.1 mg via ORAL
  Filled 2023-06-07 (×9): qty 1

## 2023-06-07 MED ORDER — DICYCLOMINE HCL 20 MG PO TABS: 20.0000 mg | ORAL_TABLET | Freq: Four times a day (QID) | ORAL | Status: DC | PRN

## 2023-06-07 MED ORDER — CLONIDINE HCL 0.1 MG PO TABS
0.1000 mg | ORAL_TABLET | Freq: Every day | ORAL | Status: DC
Start: 2023-06-12 — End: 2023-06-11
  Filled 2023-06-07: qty 1

## 2023-06-07 MED ORDER — CLONIDINE HCL 0.1 MG PO TABS
0.1000 mg | ORAL_TABLET | ORAL | Status: AC
Start: 2023-06-09 — End: 2023-06-11
  Administered 2023-06-09 – 2023-06-11 (×4): 0.1 mg via ORAL
  Filled 2023-06-07 (×4): qty 1

## 2023-06-07 NOTE — BHH Counselor (Signed)
Adult Comprehensive Assessment  Patient ID: Ralph Fitzgerald, male   DOB: [birth year], 37 y.o.   MRN: [id number]  Information Source: Information source: Patient  Current Stressors:  Patient states their primary concerns and needs for treatment are:: "to be more spiritual, talk to God more." Patient states their goals for this hospitilization and ongoing recovery are:: "to figure out what I need to do to get Suboxone. 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, petroleum / Lack of resources (include bankruptcy): "it could always be better" Housing / Lack of housing: "I am moving in with my dad" Physical health (include injuries & life threatening diseases): "my foot" Social relationships: "fine" Substance abuse: "I need help with Opiates" Bereavement / Loss: "my relationship with my wife."  Living/Environment/Situation:  Living Arrangements: Spouse/significant other Who else lives in the home?: stepchildren What is atmosphere in current home: Comfortable  Family History:  Marital status: Married What is your sexual orientation?: heterosexual Does patient have children?: Yes How many children?: 1 How is patient's relationship with their children?: "he lives in [place] with his mother. I see him every other weekend."  Childhood History:  By whom was/is the patient raised?: Both parents Additional childhood history information: my parents divorced when i was young, I saw both my parents regularly Description of patient's relationship with caregiver when they were a child: good Patient's description of current relationship with people who raised him/her: fine Does patient have siblings?: No Did patient suffer any verbal/emotional/physical/sexual abuse as a child?: No Did patient suffer from severe childhood neglect?: No Has patient ever been sexually  abused/assaulted/raped as an adolescent or adult?: No Was the patient ever a victim of a crime or a disaster?: No Witnessed domestic violence?: No Has patient been affected by domestic violence as an adult?: No  Education:  Highest grade of school patient has completed: trade school Currently a student?: No Learning disability?: No  Employment/Work Situation:   Employment Situation: Employed Where is Patient Currently Employed?: [employer] How Long has Patient Been Employed?: few years Are You Satisfied With Your Job?: Yes Do You Work More Than One Job?: No Work Stressors: none Patient's Job has Been Impacted by Current Illness: No What is the Longest Time Patient has Held a Job?: few years Has Patient ever Been in the [employer]?: No  Financial Resources:   Financial resources: Income from employment Does patient have a representative payee or guardian?: No  Alcohol/Substance Abuse:   What has been your use of drugs/alcohol within the last 12 months?: "I use opioids" If attempted suicide, did drugs/alcohol play a role in this?: No (he reports no but his medical records reports it was a suicide attempt) Alcohol/Substance Abuse Treatment Hx: Past Tx, Inpatient If yes, describe treatment: 6 months of treatment in [place]  Social Support System:   Patient's Community Support System: Fair Type of faith/religion: Christian How does patient's faith help to cope with current illness?: prayer  Leisure/Recreation:   Do You Have Hobbies?: Yes Leisure and Hobbies: baseball, giving, helping and building  Strengths/Needs:   What is the patient's perception of their strengths?: "to help me get through the bad moments"  Discharge Plan:   Currently receiving community mental health services: No Patient states concerns and preferences for aftercare planning are: "I need a therapist" Patient states they will know when they are safe and ready for discharge  when: "not sure" Does  patient have access to transportation?: Yes Does patient have financial barriers related to discharge medications?: No Patient description of barriers related to discharge medications: has insurance Plan for living situation after discharge: will move in with his father Will patient be returning to same living situation after discharge?: No  Summary/Recommendations:   Summary and Recommendations (to be completed by the evaluator): Ralph Fitzgerald is a 37 year old male that was admitted into [facility] on 06/06/2023 due to a suicide attempt. He has a past history of of anxiety, mood disorder, opiate use disorder.  He was hospitalized after being found unresponsive in a vehicle. He was found with multiple pill bottles in the vehicle and loose pills in his pockets.  He has received SA treatment in the past at a facility in [place] for 6 months. He is not connected to community mental health. While here, Ralph Fitzgerald can benefit from crisis stabilization, medication management, therapeutic milieu, and referrals for services.   Ralph Fitzgerald. 06/07/2023

## 2023-06-07 NOTE — Group Note (Signed)
Recreation Therapy Group Note   Group Topic:Health and Wellness  Group Date: 06/07/2023 Start Time: 0930 End Time: 1005 Facilitators: Islah Eve-McCall, LRT,CTRS Location: [address] Dayroom   Group Topic: Exercise/Wellness  Goal Area(s) Addresses:  Patient will verbalize benefit of exercise during group session. Patient will identify an exercise that can be completed post d/c. Patient will acknowledge benefits of exercise when used as a coping mechanism.   Group Description: Patients and LRT discussed the importance of physical exercise and its benefits. During group, patients took turns leading the group in the exercises/stretches of their choosing. Patients completed three rounds of exercise. Patients could get water or take a break if needed.  Education: Physical Activity, Health and Wellness  Education Outcome: Acknowledges understanding/In group clarification offered/Needs additional education.    Affect/Mood: N/A   Participation Level: Did not attend    Clinical Observations/Individualized Feedback:      Plan: Continue to engage patient in RT group sessions 2-3x/week.   Jennavieve Arrick-McCall, LRT,CTRS 06/07/2023 11:29 AM

## 2023-06-07 NOTE — BH IP Treatment Plan (Signed)
Interdisciplinary Treatment and Diagnostic Plan Update  06/07/2023 Time of Session: 10:30AM Ralph Fitzgerald MRN: [id number]  Principal Diagnosis: Major depressive disorder, recurrent episode, severe (HCC)  Secondary Diagnoses: Principal Problem:   Major depressive disorder, recurrent episode, severe (HCC) Active Problems:   Opiate overdose (HCC)   Current Medications:  Current Facility-Administered Medications  Medication Dose Route Frequency Provider Last Rate Last Admin   acetaminophen (TYLENOL) tablet 650 mg  650 mg Oral Q6H PRN Caprice Kluver, MD   650 mg at 06/07/23 1044   alum & mag hydroxide-simeth (MAALOX/MYLANTA) 200-200-20 MG/5ML suspension 30 mL  30 mL Oral Q4H PRN Caprice Kluver, MD       cloNIDine (CATAPRES) tablet 0.1 mg  0.1 mg Oral QID Tomie China, MD   0.1 mg at 06/07/23 1252   Followed by   Melene Muller ON 06/09/2023] cloNIDine (CATAPRES) tablet 0.1 mg  0.1 mg Oral Reginal Lutes, MD       Followed by   Melene Muller ON 06/12/2023] cloNIDine (CATAPRES) tablet 0.1 mg  0.1 mg Oral QAC breakfast Tomie China, MD       dicyclomine (BENTYL) tablet 20 mg  20 mg Oral Q6H PRN Tomie China, MD       feeding supplement (ENSURE ENLIVE / ENSURE PLUS) liquid 237 mL  237 mL Oral BID BM Bobbitt, Shalon E, NP       hydrOXYzine (ATARAX) tablet 25 mg  25 mg Oral Q6H PRN Tomie China, MD   25 mg at 06/07/23 1252   loperamide (IMODIUM) capsule 2-4 mg  2-4 mg Oral PRN Tomie China, MD       LORazepam (ATIVAN) tablet 1 mg  1 mg Oral Q6H PRN Tomie China, MD       magnesium hydroxide (MILK OF MAGNESIA) suspension 30 mL  30 mL Oral Daily PRN Caprice Kluver, MD       methocarbamol (ROBAXIN) tablet 500 mg  500 mg Oral Q8H PRN Tomie China, MD   500 mg at 06/07/23 1252   nicotine (NICODERM CQ - dosed in mg/24 hours) patch 21 mg  21 mg Transdermal Daily Tomie China, MD       ondansetron (ZOFRAN-ODT) disintegrating tablet 4 mg  4 mg Oral  Q6H PRN Tomie China, MD       PARoxetine (PAXIL) tablet 20 mg  20 mg Oral Daily Tomie China, MD       QUEtiapine (SEROQUEL) tablet 200 mg  200 mg Oral QHS Tomie China, MD       PTA Medications: Medications Prior to Admission  Medication Sig Dispense Refill Last Dose   cetirizine (ZYRTEC) 10 MG tablet Take 10 mg by mouth daily.      escitalopram (LEXAPRO) 20 MG tablet Take 20 mg by mouth daily.      gabapentin (NEURONTIN) 600 MG tablet Take 600 mg by mouth daily.      Multiple Vitamin (MULTIVITAMIN) tablet Take 1 tablet by mouth daily.      pantoprazole (PROTONIX) 40 MG tablet Take 40 mg by mouth daily.      QUEtiapine (SEROQUEL) 100 MG tablet Take 200 mg by mouth at bedtime.       Patient Stressors: Marital or family conflict   Occupational concerns   Substance abuse    Patient Strengths: Active sense of humor  Capable of independent living  Financial means  Work skills   Treatment Modalities: Medication Management, Group therapy, Case management,  1 to 1 session with clinician, Psychoeducation, Recreational therapy.  Physician Treatment Plan for Primary Diagnosis: Major depressive disorder, recurrent episode, severe (HCC) Long Term Goal(s):     Short Term Goals:    Medication Management: Evaluate patient's response, side effects, and tolerance of medication regimen.  Therapeutic Interventions: 1 to 1 sessions, Unit Group sessions and Medication administration.  Evaluation of Outcomes: Not Progressing  Physician Treatment Plan for Secondary Diagnosis: Principal Problem:   Major depressive disorder, recurrent episode, severe (HCC) Active Problems:   Opiate overdose (HCC)  Long Term Goal(s):     Short Term Goals:       Medication Management: Evaluate patient's response, side effects, and tolerance of medication regimen.  Therapeutic Interventions: 1 to 1 sessions, Unit Group sessions and Medication administration.  Evaluation of Outcomes: Not  Progressing   RN Treatment Plan for Primary Diagnosis: Major depressive disorder, recurrent episode, severe (HCC) Long Term Goal(s): Knowledge of disease and therapeutic regimen to maintain health will improve  Short Term Goals: Ability to remain free from injury will improve, Ability to verbalize frustration and anger appropriately will improve, Ability to demonstrate self-control, Ability to participate in decision making will improve, Ability to verbalize feelings will improve, Ability to disclose and discuss suicidal ideas, Ability to identify and develop effective coping behaviors will improve, and Compliance with prescribed medications will improve  Medication Management: RN will administer medications as ordered by provider, will assess and evaluate patient's response and provide education to patient for prescribed medication. RN will report any adverse and/or side effects to prescribing provider.  Therapeutic Interventions: 1 on 1 counseling sessions, Psychoeducation, Medication administration, Evaluate responses to treatment, Monitor vital signs and CBGs as ordered, Perform/monitor CIWA, COWS, AIMS and Fall Risk screenings as ordered, Perform wound care treatments as ordered.  Evaluation of Outcomes: Not Progressing   LCSW Treatment Plan for Primary Diagnosis: Major depressive disorder, recurrent episode, severe (HCC) Long Term Goal(s): Safe transition to appropriate next level of care at discharge, Engage patient in therapeutic group addressing interpersonal concerns.  Short Term Goals: Engage patient in aftercare planning with referrals and resources, Increase social support, Increase ability to appropriately verbalize feelings, Increase emotional regulation, Facilitate acceptance of mental health diagnosis and concerns, Facilitate patient progression through stages of change regarding substance use diagnoses and concerns, Identify triggers associated with mental health/substance abuse  issues, and Increase skills for wellness and recovery  Therapeutic Interventions: Assess for all discharge needs, 1 to 1 time with Social worker, Explore available resources and support systems, Assess for adequacy in community support network, Educate family and significant other(s) on suicide prevention, Complete Psychosocial Assessment, Interpersonal group therapy.  Evaluation of Outcomes: Not Progressing   Progress in Treatment: Attending groups: No. Participating in groups: No. Taking medication as prescribed: Yes. Toleration medication: Yes. Family/Significant other contact made: No, will contact:  consent pending Patient understands diagnosis: Yes. Discussing patient identified problems/goals with staff: Yes. Medical problems stabilized or resolved: Yes. Denies suicidal/homicidal ideation: Yes. Issues/concerns per patient self-inventory: No.  New problem(s) identified: No, Describe:  none reported  New Short Term/Long Term Goal(s):detox, medication management for mood stabilization; elimination of SI thoughts; development of comprehensive mental wellness/sobriety plan.    Patient Goals:  "Be more positive and more spiritual"  Discharge Plan or Barriers: Patient recently admitted. CSW will continue to follow and assess for appropriate referrals and possible discharge planning.    Reason for Continuation of Hospitalization: Depression Medication stabilization Suicidal ideation Withdrawal symptoms  Estimated Length of Stay: 5-7 days  Last 3 [place] Suicide Severity Risk Score: Flowsheet  Row [facility] (Current) [facility] Hosp-Admission (Discharged) from 06/05/2023 in [place] [facility] Office Visit from 05/10/2023 in confidential department  C-SSRS RISK CATEGORY Moderate Risk Low Risk Moderate Risk       Last PHQ 2/9 Scores:    05/10/2023    3:07 PM  Depression screen PHQ 2/9  Decreased  Interest 2  Down, Depressed, Hopeless 3  PHQ - 2 Score 5  Altered sleeping 0  Tired, decreased energy 2  Change in appetite 3  Feeling bad or failure about yourself  3  Trouble concentrating 3  Moving slowly or fidgety/restless 1  Suicidal thoughts 0  PHQ-9 Score 17  Difficult doing work/chores Very difficult    Scribe for Treatment Team: Kathi Der, LCSWA 06/07/2023 1:06 PM

## 2023-06-07 NOTE — Plan of Care (Signed)

## 2023-06-07 NOTE — Progress Notes (Signed)
Patient ID: Ralph Fitzgerald, male   DOB: Oct 05, 1985, 37 y.o.   MRN: [id number] Pt presents with depressed mood, affect congruent. [name] reports he is feeling '' depressed '' and appears very tearful when talking. He voices that he is ''aware I almost did myself in '' and expressed to the patient that this hospitalization offers opportunity for him to improve. He is also reporting longstanding hx of substance abuse and states he had not been taking '' many percocets or pain pills but I am having some withdrawal. I have hot and sweats and I don't feel well. My nose is also running. ''  Pt offered prn tylenol which he accepted and encouraged po fluids intake with gatorade given.  Pt denies any current SI. Is visible on the unit and able to make his needs known. Will con't to monitor.

## 2023-06-07 NOTE — [facility] Group Notes (Signed)
Spiritual care group on grief and loss facilitated by Chaplain Dyanne Carrel, [facility]  Group Goal: Support / Education around grief and loss  Members engage in facilitated group support and psycho-social education.  Group Description:  Following introductions and group rules, group members engaged in facilitated group dialogue and support around topic of loss, with particular support around experiences of loss in their lives. Group Identified types of loss (relationships / self / things) and identified patterns, circumstances, and changes that precipitate losses. Reflected on thoughts / feelings around loss, normalized grief responses, and recognized variety in grief experience. Group encouraged individual reflection on safe space and on the coping skills that they are already utilizing.  Group drew on Adlerian / Rogerian and narrative framework  Patient Progress: Ralph Fitzgerald attended group.  He did not verbally participate, but he demonstrated engagement and was tearful on and off during group.  Chaplain will follow up with him individually.

## 2023-06-07 NOTE — Tx Team (Signed)
Initial Treatment Plan 06/07/2023 12:47 AM TRAN KIMAK [id number]    PATIENT STRESSORS: Marital or family conflict   Occupational concerns   Substance abuse     PATIENT STRENGTHS: Active sense of humor  Capable of independent living  Financial means  Work skills    PATIENT IDENTIFIED PROBLEMS: Suicidal   Substance use  depression  "Being on the right medications"               DISCHARGE CRITERIA:  Ability to meet basic life and health needs Adequate post-discharge living arrangements Improved stabilization in mood, thinking, and/or behavior Motivation to continue treatment in a less acute level of care  PRELIMINARY DISCHARGE PLAN: Attend aftercare/continuing care group Attend PHP/IOP Outpatient therapy Return to previous living arrangement Return to previous work or school arrangements  PATIENT/FAMILY INVOLVEMENT: This treatment plan has been presented to and reviewed with the patient, Ralph Fitzgerald, and/or family member.  The patient and family have been given the opportunity to ask questions and make suggestions.  Bethann Punches, RN 06/07/2023, 12:47 AM

## 2023-06-07 NOTE — Progress Notes (Signed)
DAGO MOM is a 37 y.o. male involuntarily admitted for suicde attempt via overdose. Pt stated he is having issue with his wife whom he found out that is cheating on him. Pt does not remember how he got into his car or when he overdosed. Pt has been cooperative with admission process, alert and oriented 4 x. Pt denied SI/HI and contacted for safety. Consents signed, skin/belongings search completed and pt oriented to unit. Pt stable at this time. Pt given the opportunity to express concerns and ask questions. Pt given toiletries. Will continue to monitor.

## 2023-06-07 NOTE — Plan of Care (Signed)
Problem: Education: Goal: Knowledge of [facility] Education information/materials will improve Outcome: Progressing   Problem: Activity: Goal: Interest or engagement in activities will improve Outcome: Progressing   Problem: Coping: Goal: Ability to verbalize frustrations and anger appropriately will improve Outcome: Progressing

## 2023-06-07 NOTE — Group Note (Signed)
Occupational Therapy Group Note  Group Topic: Sleep Hygiene  Group Date: 06/07/2023 Start Time: 1430 End Time: 1500 Facilitators: Ted Mcalpine, OT   Group Description: Group encouraged increased participation and engagement through topic focused on sleep hygiene. Patients reflected on the quality of sleep they typically receive and identified areas that need improvement. Group was given background information on sleep and sleep hygiene, including common sleep disorders. Group members also received information on how to improve one's sleep and introduced a sleep diary as a tool that can be utilized to track sleep quality over a length of time. Group session ended with patients identifying one or more strategies they could utilize or implement into their sleep routine in order to improve overall sleep quality.        Therapeutic Goal(s):  Identify one or more strategies to improve overall sleep hygiene  Identify one or more areas of sleep that are negatively impacted (sleep too much, too little, etc)     Participation Level: Engaged   Participation Quality: Independent   Behavior: Appropriate   Speech/Thought Process: Relevant   Affect/Mood: Appropriate   Insight: Fair   Judgement: Fair      Modes of Intervention: Education  Patient Response to Interventions:  Attentive   Plan: Continue to engage patient in OT groups 2 - 3x/week.  06/07/2023  Ted Mcalpine, OT   Kerrin Champagne, OT

## 2023-06-07 NOTE — [facility] Group Notes (Signed)
[facility] Group Notes:  (Nursing/MHT/Case Management/Adjunct)  Date:  06/07/2023  Time:  2000  Type of Therapy:   AA Group  Participation Level:  Did Not Attend  Participation Quality:   N/A  Affect:   N/A  Cognitive:   N/A  Insight:  None  Engagement in Group:   N/A  Modes of Intervention:   N/A  Summary of Progress/Problems: Pt. Were aware of group. Pt. Refused.  Fay Records 06/07/2023, 10:50 PM
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
## 2023-06-08 LAB — VITAMIN B12: Vitamin B-12: 392 pg/mL (ref 180–914)

## 2023-06-08 LAB — FOLATE: Folate: 17.4 ng/mL (ref >5.9)

## 2023-06-08 LAB — VITAMIN D 25 HYDROXY (VIT D DEFICIENCY, FRACTURES): Vit D, 25-Hydroxy: 44.18 ng/mL (ref 30–100)

## 2023-06-08 NOTE — Plan of Care (Signed)
  Problem: Education: Goal: Emotional status will improve Outcome: Progressing Goal: Mental status will improve Outcome: Progressing   Problem: Safety: Goal: Periods of time without injury will increase Outcome: Progressing   

## 2023-06-08 NOTE — Plan of Care (Signed)
?  Problem: Activity: ?Goal: Interest or engagement in activities will improve ?Outcome: Progressing ?Goal: Sleeping patterns will improve ?Outcome: Progressing ?  ?Problem: Coping: ?Goal: Ability to verbalize frustrations and anger appropriately will improve ?Outcome: Progressing ?Goal: Ability to demonstrate self-control will improve ?Outcome: Progressing ?  ?Problem: Safety: ?Goal: Periods of time without injury will increase ?Outcome: Progressing ?  ?

## 2023-06-08 NOTE — Progress Notes (Signed)
   06/08/23 2015  Psych Admission Type (Psych Patients Only)  Admission Status Involuntary  Psychosocial Assessment  Patient Complaints Anxiety  Eye Contact Fair  Facial Expression Flat  Affect Sad  Speech Soft  Interaction Assertive  Motor Activity Slow  Appearance/Hygiene In scrubs  Behavior Characteristics Cooperative  Mood Pleasant  Aggressive Behavior  Effect No apparent injury  Thought Process  Coherency WDL  Content WDL  Delusions WDL  Perception WDL  Hallucination None reported or observed  Judgment WDL  Confusion WDL  Danger to Self  Current suicidal ideation? Denies  Danger to Others  Danger to Others None reported or observed
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
## 2023-06-08 NOTE — Group Note (Signed)
[facility] LCSW Group Therapy Note   Group Date: 06/08/2023 Start Time: 1100 End Time: 1200  Type of Therapy:  Group Therapy  Participation Level:  Active  Participation Quality:  Appropriate  Affect:  Appropriate  Cognitive:  Appropriate  Insight:  Developing/Improving  Engagement in Therapy:  Developing/Improving  Modes of Intervention:  Activity, Discussion, Rapport Building, Socialization and Support  Summary of Progress/Problems: Patient actively participated in group on today. Group started off with introductions and group rules. Group members participated in a therapeutic activity that required active listening and communication skills. Group members were able to identify similarities and differences within the group. Patient reports his goal prior to discharge is to continue being honest with himself and search more of who he is, and to learn to trust the process. No issues to report.   Loleta Dicker, LCSW

## 2023-06-08 NOTE — Plan of Care (Signed)
Problem: Education: Goal: Emotional status will improve Outcome: Progressing Goal: Mental status will improve Outcome: Progressing   Problem: Activity: Goal: Interest or engagement in activities will improve Outcome: Progressing Goal: Sleeping patterns will improve Outcome: Progressing

## 2023-06-08 NOTE — Group Note (Signed)
Date:  06/08/2023 Time:  9:46 AM  Group Topic/Focus:  Developing a Wellness Toolbox:   The focus of this group is to help patients develop a "wellness toolbox" with skills and strategies to promote recovery upon discharge.    Participation Level:  Active  Participation Quality:  Appropriate  Affect:  Appropriate  Cognitive:  Alert  Insight: Good  Engagement in Group:  Engaged  Modes of Intervention:  Education  Additional Comments:    Beckie Busing 06/08/2023, 9:46 AM

## 2023-06-08 NOTE — Group Note (Signed)
Date:  06/08/2023 Time:  9:42 AM  Group Topic/Focus:  Goals Group:   The focus of this group is to help patients establish daily goals to achieve during treatment and discuss how the patient can incorporate goal setting into their daily lives to aide in recovery.    Participation Level:  Active  Participation Quality:  Appropriate  Affect:  Appropriate  Cognitive:  Alert  Insight: Good  Engagement in Group:  Engaged  Modes of Intervention:  Discussion  Additional Comments:    Beckie Busing 06/08/2023, 9:42 AM

## 2023-06-08 NOTE — [facility] Group Notes (Signed)
Adult Psychoeducational Group Note  Date:  06/08/2023 Time:  8:54 PM  Group Topic/Focus:  Wrap-Up Group:   The focus of this group is to help patients review their daily goal of treatment and discuss progress on daily workbooks.  Participation Level:  Active  Participation Quality:  Appropriate  Affect:  Appropriate  Cognitive:  Appropriate  Insight: Appropriate  Engagement in Group:  Engaged  Modes of Intervention:  Discussion  Additional Comments:  Pt stated day was a 7, goal today was to get in touch with spiritual side. Pt said coping skill used was reading the bible, and praying.  Joselyn Arrow 06/08/2023, 8:54 PM

## 2023-06-08 NOTE — Progress Notes (Signed)
   06/08/23 0000  Psych Admission Type (Psych Patients Only)  Admission Status Involuntary  Psychosocial Assessment  Patient Complaints Anxiety;Substance abuse  Eye Contact Fair  Facial Expression Flat  Affect Sad  Speech Soft  Interaction Assertive  Motor Activity Slow  Appearance/Hygiene In scrubs  Behavior Characteristics Cooperative  Mood Depressed  Aggressive Behavior  Effect No apparent injury  Thought Process  Coherency WDL  Content WDL  Delusions WDL  Perception WDL  Hallucination None reported or observed  Judgment WDL  Confusion WDL  Danger to Self  Current suicidal ideation? Denies  Danger to Others  Danger to Others None reported or observed

## 2023-06-08 NOTE — Progress Notes (Signed)
Patient rated his anxiety level 5/10 and his depression level 5/10 with 10 being the highest and 0 none. Patient identified his goal for today as, " Just making it through". Medication and group compliant. Minimal interaction observed with peers. Appetite good on shift. COWS=0. Safety maintained.  06/08/23 0810  Psych Admission Type (Psych Patients Only)  Admission Status Involuntary  Psychosocial Assessment  Patient Complaints Anxiety;Depression  Eye Contact Fair  Facial Expression Flat  Affect Sad  Speech Soft  Interaction Assertive  Motor Activity Slow  Appearance/Hygiene In scrubs  Behavior Characteristics Cooperative  Mood Depressed;Anxious  Thought Process  Coherency WDL  Content WDL  Delusions None reported or observed  Perception WDL  Hallucination None reported or observed  Judgment WDL  Confusion WDL  Danger to Self  Current suicidal ideation? Denies  Agreement Not to Harm Self Yes  Description of Agreement Verbal  Danger to Others  Danger to Others None reported or observed

## 2023-06-08 NOTE — Group Note (Signed)
Recreation Therapy Group Note   Group Topic:Animal Assisted Therapy   Group Date: 06/08/2023 Start Time: 0950 End Time: 1030 Facilitators: Trayquan Kolakowski-McCall, LRT,CTRS Location: [address]   Animal-Assisted Activity (AAA) Program Checklist/Progress Notes Patient Eligibility Criteria Checklist & Daily Group note for Rec Tx Intervention  AAA/T Program Assumption of Risk Form signed by Patient/ or Parent Legal Guardian Yes  Patient is free of allergies or severe asthma Yes  Patient reports no fear of animals Yes  Patient reports no history of cruelty to animals Yes  Patient understands his/her participation is voluntary Yes  Patient washes hands before animal contact Yes  Patient washes hands after animal contact Yes  Education: Hand Washing, Appropriate Animal Interaction   Education Outcome: Acknowledges education.    Affect/Mood: Appropriate   Participation Level: Engaged   Participation Quality: Independent   Behavior: Appropriate   Speech/Thought Process: Focused   Insight: Good   Judgement: Good   Modes of Intervention: Teaching laboratory technician   Patient Response to Interventions:  Engaged   Education Outcome:  In group clarification offered    Clinical Observations/Individualized Feedback: Patient attended session and interacted appropriately with therapy dog and peers. Patient asked appropriate questions about therapy dog and his training. Patient shared stories about their pets at home with group.    Plan: Continue to engage patient in RT group sessions 2-3x/week.   Ramondo Dietze-McCall, LRT,CTRS  06/08/2023 12:12 PM

## 2023-06-09 NOTE — Progress Notes (Signed)
   06/09/23 2045  Psych Admission Type (Psych Patients Only)  Admission Status Involuntary  Psychosocial Assessment  Patient Complaints Anxiety  Eye Contact Fair  Facial Expression Flat  Affect Sad  Speech Soft  Interaction Assertive  Motor Activity Slow  Appearance/Hygiene In scrubs  Behavior Characteristics Cooperative  Mood Anxious  Aggressive Behavior  Effect No apparent injury  Thought Process  Coherency WDL  Content WDL  Delusions WDL  Perception WDL  Hallucination None reported or observed  Judgment WDL  Confusion WDL  Danger to Self  Current suicidal ideation? Denies  Danger to Others  Danger to Others None reported or observed

## 2023-06-09 NOTE — Group Note (Signed)
Recreation Therapy Group Note   Group Topic:Team Building  Group Date: 06/09/2023 Start Time: 0935 End Time: 1015 Facilitators: Caydin Yeatts-McCall, LRT,CTRS Location: [address]   Group Topic: Communication, Team Building, Problem Solving  Goal Area(s) Addresses:  Patient will effectively work with peer towards shared goal.  Patient will identify skills used to make activity successful.  Patient will identify how skills used during activity can be used to reach post d/c goals.   Intervention: STEM Activity  Group Description: Straw Bridge. In teams of 3-5, patients were given 15 plastic drinking straws and an equal length of masking tape. Using the materials provided, patients were instructed to build a free standing bridge-like structure to suspend an everyday item (ex: puzzle box) off of the floor or table surface. All materials were required to be used by the team in their design. LRT facilitated post-activity discussion reviewing team process. Patients were encouraged to reflect how the skills used in this activity can be generalized to daily life post discharge.   Education: Pharmacist, community, Scientist, physiological, Discharge Planning   Education Outcome: Acknowledges education/In group clarification offered/Needs additional education.    Affect/Mood: N/A   Participation Level: Did not attend    Clinical Observations/Individualized Feedback:     Plan: Continue to engage patient in RT group sessions 2-3x/week.   Estalene Bergey-McCall, LRT,CTRS 06/09/2023 12:21 PM

## 2023-06-09 NOTE — [facility] Group Notes (Signed)
Adult Psychoeducational Group Note  Date:  06/09/2023 Time:  10:21 PM  Group Topic/Focus:  Wrap-Up Group:   The focus of this group is to help patients review their daily goal of treatment and discuss progress on daily workbooks.  Participation Level:  Active  Participation Quality:  Appropriate  Affect:  Appropriate  Cognitive:  Appropriate  Insight: Appropriate  Engagement in Group:  Engaged  Modes of Intervention:  Discussion and Support  Additional Comments:  Pt attended and participated in NA group.  Ralph Fitzgerald 06/09/2023, 10:21 PM

## 2023-06-09 NOTE — Plan of Care (Signed)
  Problem: Education: Goal: Emotional status will improve Outcome: Progressing   Problem: Education: Goal: Mental status will improve Outcome: Progressing   Problem: Activity: Goal: Interest or engagement in activities will improve Outcome: Progressing Goal: Sleeping patterns will improve Outcome: Progressing   Problem: Coping: Goal: Ability to verbalize frustrations and anger appropriately will improve Outcome: Progressing Goal: Ability to demonstrate self-control will improve Outcome: Progressing   Problem: Safety: Goal: Periods of time without injury will increase Outcome: Progressing

## 2023-06-09 NOTE — Progress Notes (Signed)
   06/09/23 0815  Psych Admission Type (Psych Patients Only)  Admission Status Involuntary  Psychosocial Assessment  Patient Complaints Anxiety;Depression  Eye Contact Fair  Facial Expression Animated  Affect Anxious;Appropriate to circumstance  Speech Logical/coherent  Interaction Assertive  Motor Activity Slow  Appearance/Hygiene In scrubs  Behavior Characteristics Cooperative;Appropriate to situation  Mood Anxious;Depressed;Pleasant  Thought Process  Coherency WDL  Content WDL  Delusions None reported or observed  Perception WDL  Hallucination None reported or observed  Judgment WDL  Confusion WDL  Danger to Self  Current suicidal ideation? Denies  Agreement Not to Harm Self Yes  Description of Agreement Verbal  Danger to Others  Danger to Others None reported or observed
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
## 2023-06-10 DIAGNOSIS — F332 Major depressive disorder, recurrent severe without psychotic features: Secondary | ICD-10-CM | POA: Diagnosis not present

## 2023-06-10 NOTE — Progress Notes (Addendum)
D. Pt has been visible in the milieu, observed interacting appropriately with peers and attending groups. Per pt's self inventory, pt rated his depression,hopelessness and anxiety a 7/1/5, respectively. Pt's goal today is to work on "patience and self-control", and stated that he would, "read and humble myself," to achieve that goal. Pt currently denies withdrawal symptoms, SI/HI and AVH  A. Labs and vitals monitored. Pt given and educated on medications. Pt supported emotionally and encouraged to express concerns and ask questions.   R. Pt remains safe with 15 minute checks. Will continue POC.    06/10/23 0900  Psych Admission Type (Psych Patients Only)  Admission Status Involuntary  Psychosocial Assessment  Patient Complaints Anxiety  Eye Contact Fair  Facial Expression Anxious  Affect Appropriate to circumstance  Speech Logical/coherent  Interaction Assertive  Motor Activity Other (Comment) (steady gait)  Appearance/Hygiene Unremarkable  Behavior Characteristics Cooperative;Appropriate to situation  Mood Anxious  Aggressive Behavior  Effect No apparent injury  Thought Process  Coherency WDL  Content WDL  Delusions WDL  Perception WDL  Hallucination None reported or observed  Judgment WDL  Confusion WDL  Danger to Self  Current suicidal ideation? Denies  Danger to Others  Danger to Others None reported or observed
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
## 2023-06-10 NOTE — [facility] Group Notes (Signed)
[facility] Group Notes:  (Nursing/MHT/Case Management/Adjunct)  Date:  06/10/2023  Time:  9:15 PM  Type of Therapy:   Wrap-up group  Participation Level:  Active  Participation Quality:  Appropriate  Affect:  Appropriate  Cognitive:  Appropriate  Insight:  Appropriate  Engagement in Group:  Engaged  Modes of Intervention:  Education  Summary of Progress/Problems: Goal stay positive. Rated day 7/10.  Ralph Fitzgerald 06/10/2023, 9:15 PM
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
## 2023-06-10 NOTE — Group Note (Signed)
Date:  06/10/2023 Time:  3:13 PM  Group Topic/Focus:  Identifying Needs:   The focus of this group is to help patients identify their personal needs that have been historically problematic and identify healthy behaviors to address their needs.    Participation Level:  Active  Participation Quality:  Appropriate  Affect:  Appropriate  Cognitive:  Appropriate  Insight: Appropriate  Engagement in Group:  Engaged  Modes of Intervention:  Discussion and Education  Additional Comments:    Ralph Fitzgerald 06/10/2023, 3:13 PM
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
## 2023-06-10 NOTE — Group Note (Signed)
Date:  06/10/2023 Time:  9:14 AM  Group Topic/Focus:  Goals Group:   The focus of this group is to help patients establish daily goals to achieve during treatment and discuss how the patient can incorporate goal setting into their daily lives to aide in recovery. Orientation:   The focus of this group is to educate the patient on the purpose and policies of crisis stabilization and provide a format to answer questions about their admission.  The group details unit policies and expectations of patients while admitted.    Participation Level:  Active  Participation Quality:  Appropriate  Affect:  Appropriate  Cognitive:  Appropriate  Insight: Appropriate  Engagement in Group:  Engaged  Modes of Intervention:  Discussion and Education  Additional Comments:    Ralph Fitzgerald 06/10/2023, 9:14 AM

## 2023-06-10 NOTE — Plan of Care (Signed)
  Problem: Education: Goal: Emotional status will improve Outcome: Progressing Goal: Mental status will improve Outcome: Progressing   Problem: Activity: Goal: Interest or engagement in activities will improve Outcome: Progressing Goal: Sleeping patterns will improve Outcome: Progressing   Problem: Coping: Goal: Ability to verbalize frustrations and anger appropriately will improve Outcome: Progressing Goal: Ability to demonstrate self-control will improve Outcome: Progressing   Problem: Physical Regulation: Goal: Ability to maintain clinical measurements within normal limits will improve Outcome: Progressing   Problem: Safety: Goal: Periods of time without injury will increase Outcome: Progressing

## 2023-06-10 NOTE — Progress Notes (Signed)
   06/10/23 2015  Psych Admission Type (Psych Patients Only)  Admission Status Involuntary  Psychosocial Assessment  Patient Complaints Anxiety  Eye Contact Fair  Facial Expression Flat  Affect Sad  Speech Soft  Interaction Assertive  Motor Activity Slow  Appearance/Hygiene In scrubs  Behavior Characteristics Cooperative;Appropriate to situation  Mood Anxious  Aggressive Behavior  Effect No apparent injury  Thought Process  Coherency WDL  Content WDL  Delusions WDL  Perception WDL  Hallucination None reported or observed  Judgment WDL  Confusion WDL  Danger to Self  Current suicidal ideation? Denies  Danger to Others  Danger to Others None reported or observed

## 2023-06-10 NOTE — Plan of Care (Signed)
  Problem: Activity: Goal: Interest or engagement in activities will improve Outcome: Progressing   Problem: Coping: Goal: Ability to verbalize frustrations and anger appropriately will improve Outcome: Progressing Goal: Ability to demonstrate self-control will improve Outcome: Progressing   

## 2023-06-11 DIAGNOSIS — F332 Major depressive disorder, recurrent severe without psychotic features: Secondary | ICD-10-CM | POA: Diagnosis not present

## 2023-06-11 LAB — VITAMIN B1: Vitamin B1 (Thiamine): 130.8 nmol/L (ref 66.5–200.0)

## 2023-06-11 MED ORDER — HYDROXYZINE HCL 25 MG PO TABS: 25.0000 mg | ORAL_TABLET | Freq: Four times a day (QID) | ORAL | 0 refills | Status: AC | PRN

## 2023-06-11 MED ORDER — PAROXETINE HCL 20 MG PO TABS: 20.0000 mg | ORAL_TABLET | Freq: Every day | ORAL | 0 refills | Status: AC

## 2023-06-11 NOTE — [facility] Suicide Risk Assessment (Signed)
[facility] Discharge Suicide Risk Assessment   Principal Problem: Major depressive disorder, recurrent episode, severe (HCC) Discharge Diagnoses: Principal Problem:   Major depressive disorder, recurrent episode, severe (HCC) Active Problems:   Opiate overdose (HCC)   Total Time spent with patient: 45 minutes  Musculoskeletal: Strength & Muscle Tone: within normal limits Gait & Station: normal Patient leans: N/A  Psychiatric Specialty Exam  Presentation  General Appearance:  Casual  Eye Contact: Fair  Speech: Clear and Coherent  Speech Volume: Normal  Handedness: Right   Mood and Affect  Mood: Anxious  Duration of Depression Symptoms: No data recorded Affect: Appropriate   Thought Process  Thought Processes: Goal Directed  Descriptions of Associations:Intact  Orientation:Full (Time, Place and Person)  Thought Content:Logical  History of Schizophrenia/Schizoaffective disorder:No data recorded Duration of Psychotic Symptoms:No data recorded Hallucinations:Hallucinations: None  Ideas of Reference:None  Suicidal Thoughts:Suicidal Thoughts: No  Homicidal Thoughts:Homicidal Thoughts: No   Sensorium  Memory: Immediate Fair; Remote Fair; Recent Fair  Judgment: Fair  Insight: Fair   Art therapist  Concentration: Fair  Attention Span: Fair  Recall: [employer] of Knowledge: Fair  Language: Fair   Psychomotor Activity  Psychomotor Activity: Psychomotor Activity: Normal   Assets  Assets: Communication Skills; Desire for Improvement   Sleep  Sleep: Sleep: Good   Physical Exam: Physical Exam ROS Blood pressure 107/74, pulse 87, temperature 97.7 F (36.5 C), temperature source Oral, resp. rate (!) 21, height 6' (1.829 m), weight 74.4 kg, SpO2 99%. Body mass index is 22.24 kg/m.  Mental Status Per Nursing Assessment::   On Admission:  NA  Demographic Factors:  Male and Caucasian  Loss Factors: NA  Historical  Factors: Prior suicide attempts  Risk Reduction Factors:   Positive social support, Positive therapeutic relationship, and Positive coping skills or problem solving skills  Continued Clinical Symptoms:  None  Cognitive Features That Contribute To Risk:  None    Suicide Risk:  Minimal: No identifiable suicidal ideation.  Patients presenting with no risk factors but with morbid ruminations; may be classified as minimal risk based on the severity of the depressive symptoms   Follow-up Information     Ali Lowe, FNP. Go on 07/05/2023.   Why: You have an appointment for medication management services on 07/05/23 at 2:40 pm, in person. Contact information: [address] [place], [place] [address]  Phone: [phone]        [facility], [facility]. Go on 06/21/2023.   Why: You have a hospital follow up appointment for therapy services on 06/21/23 at 9:00 am.  The appointment will be held in person.  Following this appointment, you will be scheduled for a clinical assessment, to obtain therapy services. Contact information: [address] [place] [address] [phone]                 Plan Of Care/Follow-up recommendations:  Emergency Contact Plan: Patient understands to call Suicide Hotline at 69, or Mobile Crisis at [phone], call 911, or go to the nearest ER as appropriate in case of thoughts of harm to self or others, or acute onset of intolerable side effects.  Thalia Party, MD 06/11/2023, 10:33 AM

## 2023-06-11 NOTE — Group Note (Signed)
Date:  06/11/2023 Time:  11:48 AM  Group Topic/Focus:  Orientation:   The focus of this group is to educate the patient on the purpose and policies of crisis stabilization and provide a format to answer questions about their admission.  The group details unit policies and expectations of patients while admitted.    Participation Level:  Active  Participation Quality:  Attentive  Affect:  Appropriate  Cognitive:  Appropriate  Insight: Appropriate  Engagement in Group:  Engaged  Modes of Intervention:  Discussion  Additional Comments:     Reymundo Poll 06/11/2023, 11:48 AM
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
## 2023-06-11 NOTE — Progress Notes (Signed)
Patient ID: Ralph Fitzgerald, male   DOB: 09-29-1985, 37 y.o.   MRN: [id number] Order received for pt discharge. AVS reviewed with patient at length, including follow up care plan and crisis services. Pt denies any SI HI or AV hallucinations. NO signs of acute decompensation. All belongings returned and pt escorted from unit to lobby to the care of family.

## 2023-06-11 NOTE — Progress Notes (Signed)
  [facility] Adult Case Management Discharge Plan :  Will you be returning to the same living situation after discharge:  No. At discharge, do you have transportation home?: Yes,  Father Wahab Scarpinato [phone] will transport the patient back home on today by 12:00pm.   Do you have the ability to pay for your medications: Yes,  patient has insurance and assistance from family as needed.   Release of information consent forms completed and in the chart;  Patient's signature needed at discharge.  Patient to Follow up at:  Follow-up Information     Ali Lowe, FNP. Go on 07/05/2023.   Why: You have an appointment for medication management services on 07/05/23 at 2:40 pm, in person. Contact information: [address] [place], [place] [address]  Phone: [phone]        [facility], [facility]. Go on 06/21/2023.   Why: You have a hospital follow up appointment for therapy services on 06/21/23 at 9:00 am.  The appointment will be held in person.  Following this appointment, you will be scheduled for a clinical assessment, to obtain therapy services. Contact information: [address] [place] [address] [phone]                 Next level of care provider has access to [facility] Link:no  Safety Planning and Suicide Prevention discussed: Yes,  with Father Corky Ledezma [phone] on yesterday.  Father is very supportive of the patient and plan is for patient to reside with father until further notice.      Has patient been referred to the Quitline?: Patient refused referral for treatment  Patient has been referred for addiction treatment: Patient refused referral for treatment; referral information given to patient at discharge.  Loleta Dicker, LCSW 06/11/2023, 8:41 AM

## 2023-06-11 NOTE — Plan of Care (Signed)

## 2023-06-14 ENCOUNTER — Ambulatory Visit: Payer: 59 | Admitting: Psychiatry

## 2023-10-10 ENCOUNTER — Other Ambulatory Visit: Payer: Self-pay

## 2023-10-10 ENCOUNTER — Ambulatory Visit
Admission: EM | Admit: 2023-10-10 | Discharge: 2023-10-10 | Disposition: A | Discharge status: Home / Self Care | Attending: Emergency Medicine | Admitting: Emergency Medicine

## 2023-10-10 ENCOUNTER — Encounter: Payer: Self-pay | Admitting: Emergency Medicine

## 2023-10-10 DIAGNOSIS — L02411 Cutaneous abscess of right axilla: Secondary | ICD-10-CM

## 2023-10-10 MED ORDER — ACETAMINOPHEN-CODEINE 300-30 MG PO TABS: 1.0000 | ORAL_TABLET | Freq: Four times a day (QID) | ORAL | 0 refills | Status: AC | PRN

## 2023-10-10 MED ORDER — CEPHALEXIN 500 MG PO CAPS: 500.0000 mg | ORAL_CAPSULE | Freq: Three times a day (TID) | ORAL | 0 refills | Status: AC

## 2023-10-10 NOTE — Discharge Instructions (Signed)
 Take cephalexin every 8 hours for 5 days  For severe pain may use Tylenol 3 which has codeine, be mindful this will make you feel sleepy  Hold warm-hot compresses to affected area at least 4 times a day, this helps to facilitate draining, the more the better  Please return for evaluation for increased swelling, increased tenderness or pain, non healing site, non draining site, you begin to have fever or chills   We reviewed the etiology of recurrent abscesses of skin.  Skin abscesses are collections of pus within the dermis and deeper skin tissues. Skin abscesses manifest as painful, tender, fluctuant, and erythematous nodules, frequently surmounted by a pustule and surrounded by a rim of erythematous swelling.  Spontaneous drainage of purulent material may occur.  Fever can occur on occasion.    -Skin abscesses can develop in healthy individuals with no predisposing conditions other than skin or nasal carriage of Staphylococcus aureus.  Individuals in close contact with others who have active infection with skin abscesses are at increased risk which is likely to explain why twin brother has similar episodes.   In addition, any process leading to a breach in the skin barrier can also predispose to the development of a skin abscesses, such as atopic dermatitis.

## 2023-10-10 NOTE — ED Provider Notes (Addendum)
 Ralph Fitzgerald    CSN: [id number] Arrival date & time: 10/10/23  0931      History   Chief Complaint Chief Complaint  Patient presents with   SEXUALLY TRANSMITTED DISEASE   Mass    HPI Ralph Fitzgerald is a 38 y.o. male.   Presents for evaluation of a bump underneath the right arm present for 3 days.  Progressively worsening, has become severely painful, making it difficult to complete movement of the arm.  Has not attempted treatment.  Denies drainage or fever.  Endorses that he has been having penile discharge and dysuria for 3 days, unsure if related.  Was evaluated by his PCP provided urine sample, unsure if completing urinalysis or full STI testing with this but there was a issue with sampling and he is awaiting to hear back from them on Monday.    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   Substance induced mood disorder (HCC) 07/09/2020   Generalized anxiety disorder 07/09/2020   Alcohol intoxication (HCC) 07/09/2020   Wound infection    Cellulitis of left leg    Cellulitis 10/19/2019   Abnormal LFTs 10/19/2019   GERD (gastroesophageal reflux disease) 10/19/2019   Mood disorder in conditions classified elsewhere 10/19/2019    Past Surgical History:  Procedure Laterality Date   IRRIGATION AND DEBRIDEMENT FOOT Left 10/20/2019   Procedure: IRRIGATION AND DEBRIDEMENT FOOT;  Surgeon: Linus Galas, DPM;  Location: [facility] ORS;  Service: Podiatry;  Laterality: Left;       Home Medications    Prior to Admission medications   Medication Sig Start Date End Date  Taking? Authorizing Provider  acetaminophen-codeine (TYLENOL #3) 300-30 MG tablet Take 1 tablet by mouth every 6 (six) hours as needed for moderate pain (pain score 4-6). 10/10/23  Yes Courtlynn Holloman R, NP  cephALEXin (KEFLEX) 500 MG capsule Take 1 capsule (500 mg total) by mouth 3 (three) times daily for 5 days. 10/10/23 10/15/23 Yes Terran Hollenkamp R, NP  PARoxetine (PAXIL) 20 MG tablet Take 1 tablet (20 mg total) by mouth daily. 06/12/23   Thalia Party, MD  QUEtiapine (SEROQUEL) 100 MG tablet Take 200 mg by mouth at bedtime. 06/03/22 06/05/23  [provider]    Family History Family History  Problem Relation Age of Onset   Drug abuse Father     Social History Social History   Tobacco Use   Smoking status: Never   Smokeless tobacco: Never  Vaping Use   Vaping status: Some Days   Substances: Flavoring  Substance Use Topics   Alcohol use: Yes    Comment: occasional   Drug use: Yes    Types: Cocaine, Marijuana     Allergies   Meloxicam, Penicillins, and Diclofenac   Review of Systems Review of Systems   Physical Exam Triage Vital Signs ED Triage Vitals [10/10/23 0950]  Encounter Vitals Group     BP (!) 151/95     Systolic BP Percentile      Diastolic BP Percentile      Pulse Rate (!) 102  Resp 18     Temp 98.2 F (36.8 C)     Temp Source Oral     SpO2 97 %     Weight      Height      Head Circumference      Peak Flow      Pain Score 10     Pain Loc      Pain Education      Exclude from Growth Chart    No data found.  Updated Vital Signs BP (!) 151/95 (BP Location: Left Arm)   Pulse (!) 102   Temp 98.2 F (36.8 C) (Oral)   Resp 18   SpO2 97%   Visual Acuity Right Eye Distance:   Left Eye Distance:   Bilateral Distance:    Right Eye Near:   Left Eye Near:    Bilateral Near:     Physical Exam Constitutional:      Appearance: Normal appearance.  Eyes:     Extraocular Movements: Extraocular movements intact.  Pulmonary:      Effort: Pulmonary effort is normal.  Skin:    Comments: 2 x 3 cm immature cyst present to the right axilla at approximately 1-2 o'clock, tender to palpation   Neurological:     Mental Status: He is alert and oriented to person, place, and time. Mental status is at baseline.      UC Treatments / Results  Labs (all labs ordered are listed, but only abnormal results are displayed) Labs Reviewed - No data to display  EKG   Radiology No results found.  Procedures Procedures (including critical care time)  Medications Ordered in UC Medications - No data to display  Initial Impression / Assessment and Plan / UC Course  I have reviewed the triage vital signs and the nursing notes.  Pertinent labs & imaging results that were available during my care of the patient were reviewed by me and considered in my medical decision making (see chart for details).  Abscess of the right axilla  site is firm and unable to complete I&D, discussed with patient, prescribed cephalexin as well as Tylenol 3 for pain, PDMP reviewed, low risk recommended supportive care through warm compresses and over-the-counter analgesics and advised follow-up for nonhealing or nondraining site, as unable to view PCP note to determine what testing was completed and patient is unsure, declines repeat testing, will allow PCP to continue with management of urinary symptoms Final Clinical Impressions(s) / UC Diagnoses   Final diagnoses:  Abscess of right axilla     Discharge Instructions      Take cephalexin every 8 hours for 5 days  For severe pain may use Tylenol 3 which has codeine, be mindful this will make you feel sleepy  Hold warm-hot compresses to affected area at least 4 times a day, this helps to facilitate draining, the more the better  Please return for evaluation for increased swelling, increased tenderness or pain, non healing site, non draining site, you begin to have fever or chills   We reviewed  the etiology of recurrent abscesses of skin.  Skin abscesses are collections of pus within the dermis and deeper skin tissues. Skin abscesses manifest as painful, tender, fluctuant, and erythematous nodules, frequently surmounted by a pustule and surrounded by a rim of erythematous swelling.  Spontaneous drainage of purulent material may occur.  Fever can occur on occasion.    -Skin abscesses can develop in healthy individuals with no predisposing conditions other than skin  or nasal carriage of Staphylococcus aureus.  Individuals in close contact with others who have active infection with skin abscesses are at increased risk which is likely to explain why twin brother has similar episodes.   In addition, any process leading to a breach in the skin barrier can also predispose to the development of a skin abscesses, such as atopic dermatitis.      ED Prescriptions     Medication Sig Dispense Auth. Provider   cephALEXin (KEFLEX) 500 MG capsule Take 1 capsule (500 mg total) by mouth 3 (three) times daily for 5 days. 15 capsule Norwin Aleman R, NP   acetaminophen-codeine (TYLENOL #3) 300-30 MG tablet Take 1 tablet by mouth every 6 (six) hours as needed for moderate pain (pain score 4-6). 12 tablet Donal Lynam, Elita Boone, NP      I have reviewed the PDMP during this encounter.   Valinda Hoar, NP 10/10/23 1050    Salli Quarry R, [place] 10/10/23 1050

## 2023-10-10 NOTE — ED Triage Notes (Addendum)
 Patient presents to [facility] for evaluation of right armpit pain starting Friday with a lump noted, redness, warm to the touch.  This is after he went to see his PCP on Wednesday for dysuria and discharge.  There was an issue with the specimen, and they called him yesterday and said they could not run it but they would send something in.  Nothing was sent in when he checked Friday evening.  Patient is concerned both infections are related
